# Patient Record
Sex: Female | Born: 1940 | Hispanic: No | Marital: Married | Smoking: Never smoker
Health system: Southern US, Community
[De-identification: ages and names within clinical notes are randomized; demographics above are authoritative.]

## PROBLEM LIST (undated history)

## (undated) DIAGNOSIS — H3530 Unspecified macular degeneration: Secondary | ICD-10-CM

## (undated) DIAGNOSIS — I499 Cardiac arrhythmia, unspecified: Secondary | ICD-10-CM

## (undated) DIAGNOSIS — D649 Anemia, unspecified: Secondary | ICD-10-CM

## (undated) DIAGNOSIS — T884XXA Failed or difficult intubation, initial encounter: Secondary | ICD-10-CM

## (undated) DIAGNOSIS — R0600 Dyspnea, unspecified: Secondary | ICD-10-CM

## (undated) DIAGNOSIS — Z972 Presence of dental prosthetic device (complete) (partial): Secondary | ICD-10-CM

## (undated) DIAGNOSIS — K589 Irritable bowel syndrome without diarrhea: Secondary | ICD-10-CM

## (undated) DIAGNOSIS — K219 Gastro-esophageal reflux disease without esophagitis: Secondary | ICD-10-CM

## (undated) DIAGNOSIS — J45909 Unspecified asthma, uncomplicated: Secondary | ICD-10-CM

## (undated) DIAGNOSIS — I10 Essential (primary) hypertension: Secondary | ICD-10-CM

## (undated) DIAGNOSIS — C801 Malignant (primary) neoplasm, unspecified: Secondary | ICD-10-CM

## (undated) DIAGNOSIS — K529 Noninfective gastroenteritis and colitis, unspecified: Secondary | ICD-10-CM

## (undated) DIAGNOSIS — C50919 Malignant neoplasm of unspecified site of unspecified female breast: Secondary | ICD-10-CM

## (undated) HISTORY — PX: CHOLECYSTECTOMY: SHX55

## (undated) HISTORY — PX: TONSILLECTOMY: SUR1361

## (undated) HISTORY — PX: REPLACEMENT TOTAL KNEE: SUR1224

## (undated) HISTORY — PX: BREAST LUMPECTOMY: SHX2

## (undated) HISTORY — PX: ABDOMINAL HYSTERECTOMY: SHX81

## (undated) HISTORY — PX: JOINT REPLACEMENT: SHX530

## (undated) HISTORY — PX: COLON SURGERY: SHX602

## (undated) HISTORY — PX: EYE SURGERY: SHX253

## (undated) HISTORY — PX: FRACTURE SURGERY: SHX138

## (undated) HISTORY — PX: BREAST BIOPSY: SHX20

---
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
## 1993-04-07 HISTORY — PX: COLON SURGERY: SHX602

## 1993-04-07 HISTORY — PX: COLOSTOMY CLOSURE: SHX1381

## 2011-10-21 DIAGNOSIS — D649 Anemia, unspecified: Secondary | ICD-10-CM | POA: Insufficient documentation

## 2011-11-17 DIAGNOSIS — Z96659 Presence of unspecified artificial knee joint: Secondary | ICD-10-CM | POA: Insufficient documentation

## 2013-04-07 DIAGNOSIS — D649 Anemia, unspecified: Secondary | ICD-10-CM

## 2013-04-07 HISTORY — DX: Anemia, unspecified: D64.9

## 2013-06-27 DIAGNOSIS — E559 Vitamin D deficiency, unspecified: Secondary | ICD-10-CM | POA: Insufficient documentation

## 2013-06-27 DIAGNOSIS — H3530 Unspecified macular degeneration: Secondary | ICD-10-CM | POA: Insufficient documentation

## 2013-12-30 DIAGNOSIS — K580 Irritable bowel syndrome with diarrhea: Secondary | ICD-10-CM | POA: Insufficient documentation

## 2013-12-30 DIAGNOSIS — F419 Anxiety disorder, unspecified: Secondary | ICD-10-CM | POA: Insufficient documentation

## 2014-02-23 DIAGNOSIS — E669 Obesity, unspecified: Secondary | ICD-10-CM | POA: Insufficient documentation

## 2014-09-15 DIAGNOSIS — Z23 Encounter for immunization: Secondary | ICD-10-CM | POA: Insufficient documentation

## 2014-09-15 DIAGNOSIS — R21 Rash and other nonspecific skin eruption: Secondary | ICD-10-CM | POA: Insufficient documentation

## 2014-09-15 DIAGNOSIS — E781 Pure hyperglyceridemia: Secondary | ICD-10-CM | POA: Insufficient documentation

## 2014-11-18 DIAGNOSIS — K5732 Diverticulitis of large intestine without perforation or abscess without bleeding: Secondary | ICD-10-CM | POA: Insufficient documentation

## 2014-12-30 DIAGNOSIS — F40240 Claustrophobia: Secondary | ICD-10-CM | POA: Insufficient documentation

## 2016-03-06 DIAGNOSIS — K2100 Gastro-esophageal reflux disease with esophagitis, without bleeding: Secondary | ICD-10-CM | POA: Insufficient documentation

## 2016-03-06 DIAGNOSIS — J302 Other seasonal allergic rhinitis: Secondary | ICD-10-CM | POA: Insufficient documentation

## 2016-03-06 DIAGNOSIS — I10 Essential (primary) hypertension: Secondary | ICD-10-CM | POA: Insufficient documentation

## 2016-10-20 ENCOUNTER — Other Ambulatory Visit: Payer: Self-pay | Admitting: Pediatrics

## 2016-10-20 DIAGNOSIS — Z1231 Encounter for screening mammogram for malignant neoplasm of breast: Secondary | ICD-10-CM

## 2016-11-26 ENCOUNTER — Inpatient Hospital Stay: Admission: RE | Admit: 2016-11-26 | Payer: Self-pay | Source: Ambulatory Visit

## 2016-12-01 ENCOUNTER — Encounter: Payer: Self-pay | Admitting: Radiology

## 2016-12-01 ENCOUNTER — Ambulatory Visit
Admission: RE | Admit: 2016-12-01 | Discharge: 2016-12-01 | Disposition: A | Discharge status: Home / Self Care | Payer: Medicare Other | Source: Ambulatory Visit | Attending: Pediatrics | Admitting: Pediatrics

## 2016-12-01 DIAGNOSIS — Z1231 Encounter for screening mammogram for malignant neoplasm of breast: Secondary | ICD-10-CM | POA: Insufficient documentation

## 2017-01-16 DIAGNOSIS — G43009 Migraine without aura, not intractable, without status migrainosus: Secondary | ICD-10-CM | POA: Insufficient documentation

## 2017-01-22 DIAGNOSIS — M25531 Pain in right wrist: Secondary | ICD-10-CM | POA: Insufficient documentation

## 2017-01-22 DIAGNOSIS — G8929 Other chronic pain: Secondary | ICD-10-CM | POA: Insufficient documentation

## 2017-01-22 DIAGNOSIS — M1811 Unilateral primary osteoarthritis of first carpometacarpal joint, right hand: Secondary | ICD-10-CM | POA: Insufficient documentation

## 2017-10-22 DIAGNOSIS — M654 Radial styloid tenosynovitis [de Quervain]: Secondary | ICD-10-CM | POA: Insufficient documentation

## 2017-10-22 DIAGNOSIS — M79642 Pain in left hand: Secondary | ICD-10-CM | POA: Insufficient documentation

## 2017-11-18 ENCOUNTER — Other Ambulatory Visit: Payer: Self-pay | Admitting: Pediatrics

## 2017-11-18 DIAGNOSIS — Z1231 Encounter for screening mammogram for malignant neoplasm of breast: Secondary | ICD-10-CM

## 2017-12-02 ENCOUNTER — Encounter (INDEPENDENT_AMBULATORY_CARE_PROVIDER_SITE_OTHER): Payer: Self-pay

## 2017-12-02 ENCOUNTER — Ambulatory Visit
Admission: RE | Admit: 2017-12-02 | Discharge: 2017-12-02 | Disposition: A | Discharge status: Home / Self Care | Payer: Medicare Other | Source: Ambulatory Visit | Attending: Pediatrics | Admitting: Pediatrics

## 2017-12-02 DIAGNOSIS — Z1231 Encounter for screening mammogram for malignant neoplasm of breast: Secondary | ICD-10-CM

## 2017-12-02 HISTORY — DX: Malignant (primary) neoplasm, unspecified: C80.1

## 2018-01-07 DIAGNOSIS — R002 Palpitations: Secondary | ICD-10-CM | POA: Insufficient documentation

## 2018-01-07 DIAGNOSIS — R011 Cardiac murmur, unspecified: Secondary | ICD-10-CM | POA: Insufficient documentation

## 2018-03-29 ENCOUNTER — Encounter: Payer: Self-pay | Admitting: Emergency Medicine

## 2018-03-29 ENCOUNTER — Ambulatory Visit
Admission: EM | Admit: 2018-03-29 | Discharge: 2018-03-29 | Disposition: A | Discharge status: Home / Self Care | Payer: Medicare Other | Attending: Family Medicine | Admitting: Family Medicine

## 2018-03-29 ENCOUNTER — Other Ambulatory Visit: Payer: Self-pay

## 2018-03-29 DIAGNOSIS — N390 Urinary tract infection, site not specified: Secondary | ICD-10-CM | POA: Diagnosis not present

## 2018-03-29 DIAGNOSIS — R319 Hematuria, unspecified: Secondary | ICD-10-CM | POA: Insufficient documentation

## 2018-03-29 DIAGNOSIS — R42 Dizziness and giddiness: Secondary | ICD-10-CM | POA: Insufficient documentation

## 2018-03-29 HISTORY — DX: Noninfective gastroenteritis and colitis, unspecified: K52.9

## 2018-03-29 HISTORY — DX: Essential (primary) hypertension: I10

## 2018-03-29 LAB — URINALYSIS, COMPLETE (UACMP) WITH MICROSCOPIC
BILIRUBIN URINE: NEGATIVE
Glucose, UA: NEGATIVE mg/dL
Ketones, ur: NEGATIVE mg/dL
NITRITE: NEGATIVE
PROTEIN: NEGATIVE mg/dL
SPECIFIC GRAVITY, URINE: 1.01 (ref 1.005–1.030)
SQUAMOUS EPITHELIAL / LPF: NONE SEEN (ref 0–5)
pH: 7 (ref 5.0–8.0)

## 2018-03-29 MED ORDER — CEPHALEXIN 500 MG PO CAPS: 500.0000 mg | ORAL_CAPSULE | Freq: Two times a day (BID) | ORAL | 0 refills | Status: DC

## 2018-03-29 NOTE — Discharge Instructions (Addendum)
Increase water intake Over the counter dramamine as needed

## 2018-03-29 NOTE — ED Triage Notes (Signed)
Patient in today c/o dysuria and back pain x 1 week. Patient states she took OTC urinary pain relief and symptoms got better, but came back. Patient denies fever.  Patient also woke up this morning with vertigo.

## 2018-03-29 NOTE — ED Provider Notes (Signed)
[facility]    CSN: [id number] Arrival date & time: 03/29/18  1101     History   Chief Complaint Chief Complaint  Patient presents with  . Dysuria  . Dizziness    HPI Lauren Riley is a 77 y.o. female.   The history is provided by the patient.  Dysuria  Pain quality:  Burning Pain severity:  Mild Onset quality:  Sudden Duration:  6 days Timing:  Constant Progression:  Unchanged Chronicity:  New Recent urinary tract infections: no   Relieved by:  Phenazopyridine Urinary symptoms: frequent urination   Associated symptoms: no abdominal pain, no fever, no flank pain, no genital lesions, no nausea, no vaginal discharge and no vomiting   Associated symptoms comment:  Low back pain; also states this morning felt some slight vertigo which she's had before in the past; currently states not feeling vertigo or dizziness; lo   Past Medical History:  Diagnosis Date  . Cancer ([place])    skin ca  . Chronic diarrhea   . Hypertension     There are no active problems to display for this patient.   Past Surgical History:  Procedure Laterality Date  . ABDOMINAL HYSTERECTOMY    . BREAST BIOPSY     unsure which side and between 1974 and 1980 Negative was a cyst  . CHOLECYSTECTOMY    . COLON SURGERY    . FRACTURE SURGERY     femur, ulna and radius  . JOINT REPLACEMENT     bilateral knee  . REPLACEMENT TOTAL KNEE Bilateral   . TONSILLECTOMY      OB History   No obstetric history on file.      Home Medications    Prior to Admission medications   Medication Sig Start Date End Date Taking? Authorizing Provider  ALPRAZolam Duanne Moron) 0.25 MG tablet Take by mouth. 12/27/14  Yes [provider]  Cholecalciferol (VITAMIN D3) 50 MCG (2000 UT) capsule 1 tab daily   Yes [provider]  flunisolide (NASALIDE) 25 MCG/ACT (0.025%) SOLN 2 (two) times daily as needed. 10/27/13  Yes [provider]  loperamide (IMODIUM A-D) 2 MG tablet Take by  mouth.   Yes [provider]  metoprolol succinate (TOPROL-XL) 100 MG 24 hr tablet Take by mouth. 09/07/15 11/13/18 Yes [provider]  Multiple Vitamins-Minerals (PRESERVISION AREDS 2) CAPS Take by mouth.   Yes [provider]  mupirocin ointment (BACTROBAN) 2 % APPLY OINTMENT EXTERNALLY THREE TIMES DAILY FOR 7 DAYS OR UNTIL CLEARED 04/22/17  Yes [provider]  pantoprazole (PROTONIX) 40 MG tablet Take by mouth. 09/07/15 01/01/19 Yes [provider]  SUMAtriptan (IMITREX) 50 MG tablet Take by mouth. 09/07/15 11/13/18 Yes [provider]  cephALEXin (KEFLEX) 500 MG capsule Take 1 capsule (500 mg total) by mouth 2 (two) times daily. 03/29/18   Norval Gable, MD    Family History Family History  Problem Relation Age of Onset  . Breast cancer Sister 36  . Breast cancer Maternal Aunt        mat aunt  . Parkinson's disease Mother   . Congestive Heart Failure Father   . Hypertension Father     Social History Social History   Tobacco Use  . Smoking status: Never Smoker  . Smokeless tobacco: Never Used  Substance Use Topics  . Alcohol use: Never    Frequency: Never  . Drug use: Never     Allergies   Nsaids; Fenofibrate; Niacin; Pravastatin; Fish oil; and Tape  Review of Systems Review of Systems  Constitutional: Negative for fever.  Gastrointestinal: Negative for abdominal pain, nausea and vomiting.  Genitourinary: Positive for dysuria. Negative for flank pain and vaginal discharge.     Physical Exam Triage Vital Signs ED Triage Vitals  Enc Vitals Group     BP 03/29/18 1114 (!) 156/75     Pulse Rate 03/29/18 1114 83     Resp 03/29/18 1114 16     Temp 03/29/18 1114 98.3 F (36.8 C)     Temp Source 03/29/18 1114 Oral     SpO2 03/29/18 1114 97 %     Weight 03/29/18 1115 185 lb (83.9 kg)     Height 03/29/18 1115 5\' 1"  (1.549 m)     Head Circumference --      Peak Flow --      Pain Score 03/29/18 1113 4     Pain Loc --       Pain Edu? --      Excl. in [place]? --    No data found.  Updated Vital Signs BP (!) 156/75 (BP Location: Left Arm)   Pulse 83   Temp 98.3 F (36.8 C) (Oral)   Resp 16   Ht 5\' 1"  (1.549 m)   Wt 83.9 kg   LMP 12/01/2016   SpO2 97%   BMI 34.96 kg/m   Visual Acuity Right Eye Distance:   Left Eye Distance:   Bilateral Distance:    Right Eye Near:   Left Eye Near:    Bilateral Near:     Physical Exam Vitals signs and nursing note reviewed.  Constitutional:      General: She is not in acute distress.    Appearance: Normal appearance. She is not ill-appearing, toxic-appearing or diaphoretic.  Abdominal:     General: There is no distension.     Palpations: Abdomen is soft.  Neurological:     Mental Status: She is alert.      UC Treatments / Results  Labs (all labs ordered are listed, but only abnormal results are displayed) Labs Reviewed  URINALYSIS, COMPLETE (UACMP) WITH MICROSCOPIC - Abnormal; Notable for the following components:      Result Value   APPearance HAZY (*)    Hgb urine dipstick TRACE (*)    Leukocytes, UA LARGE (*)    Bacteria, UA FEW (*)    All other components within normal limits    EKG None  Radiology No results found.  Procedures Procedures (including critical care time)  Medications Ordered in UC Medications - No data to display  Initial Impression / Assessment and Plan / UC Course  I have reviewed the triage vital signs and the nursing notes.  Pertinent labs & imaging results that were available during my care of the patient were reviewed by me and considered in my medical decision making (see chart for details).      Final Clinical Impressions(s) / UC Diagnoses   Final diagnoses:  Urinary tract infection with hematuria, site unspecified  Vertigo     Discharge Instructions     Increase water intake Over the counter dramamine as needed    ED Prescriptions    Medication Sig Dispense Auth. Provider   cephALEXin  (KEFLEX) 500 MG capsule Take 1 capsule (500 mg total) by mouth 2 (two) times daily. 14 capsule Norval Gable, MD     1. Lab results and diagnosis reviewed with patient 2. rx as per orders above; reviewed possible side effects, interactions, risks  and benefits  3. Recommend supportive treatment as above  4. Follow-up prn if symptoms worsen or don't improve   Controlled Substance Prescriptions [place] Controlled Substance Registry consulted? Not Applicable   Norval Gable, MD 03/29/18 [phone]

## 2018-12-06 ENCOUNTER — Other Ambulatory Visit: Payer: Self-pay | Admitting: Pediatrics

## 2018-12-06 DIAGNOSIS — Z1231 Encounter for screening mammogram for malignant neoplasm of breast: Secondary | ICD-10-CM

## 2018-12-22 ENCOUNTER — Other Ambulatory Visit: Payer: Self-pay

## 2018-12-22 ENCOUNTER — Ambulatory Visit
Admission: RE | Admit: 2018-12-22 | Discharge: 2018-12-22 | Disposition: A | Discharge status: Home / Self Care | Payer: Medicare Other | Source: Ambulatory Visit | Attending: Pediatrics | Admitting: Pediatrics

## 2018-12-22 DIAGNOSIS — Z1231 Encounter for screening mammogram for malignant neoplasm of breast: Secondary | ICD-10-CM | POA: Insufficient documentation

## 2018-12-23 ENCOUNTER — Other Ambulatory Visit: Payer: Self-pay | Admitting: Otolaryngology

## 2018-12-23 ENCOUNTER — Other Ambulatory Visit: Payer: Self-pay

## 2018-12-23 ENCOUNTER — Ambulatory Visit
Admission: RE | Admit: 2018-12-23 | Discharge: 2018-12-23 | Disposition: A | Discharge status: Home / Self Care | Payer: Medicare Other | Attending: Otolaryngology | Admitting: Otolaryngology

## 2018-12-23 ENCOUNTER — Ambulatory Visit
Admission: RE | Admit: 2018-12-23 | Discharge: 2018-12-23 | Disposition: A | Discharge status: Home / Self Care | Payer: Medicare Other | Source: Ambulatory Visit | Attending: Otolaryngology | Admitting: Otolaryngology

## 2018-12-23 DIAGNOSIS — J321 Chronic frontal sinusitis: Secondary | ICD-10-CM | POA: Diagnosis present

## 2018-12-28 ENCOUNTER — Other Ambulatory Visit: Payer: Self-pay | Admitting: Pediatrics

## 2018-12-28 DIAGNOSIS — R928 Other abnormal and inconclusive findings on diagnostic imaging of breast: Secondary | ICD-10-CM

## 2018-12-28 DIAGNOSIS — N6310 Unspecified lump in the right breast, unspecified quadrant: Secondary | ICD-10-CM

## 2018-12-31 ENCOUNTER — Ambulatory Visit
Admission: RE | Admit: 2018-12-31 | Discharge: 2018-12-31 | Disposition: A | Discharge status: Home / Self Care | Payer: Medicare Other | Source: Ambulatory Visit | Attending: Pediatrics | Admitting: Pediatrics

## 2018-12-31 DIAGNOSIS — R928 Other abnormal and inconclusive findings on diagnostic imaging of breast: Secondary | ICD-10-CM

## 2018-12-31 DIAGNOSIS — N6310 Unspecified lump in the right breast, unspecified quadrant: Secondary | ICD-10-CM | POA: Diagnosis present

## 2019-01-04 ENCOUNTER — Other Ambulatory Visit: Payer: Self-pay | Admitting: Pediatrics

## 2019-01-04 DIAGNOSIS — R928 Other abnormal and inconclusive findings on diagnostic imaging of breast: Secondary | ICD-10-CM

## 2019-01-04 DIAGNOSIS — N6310 Unspecified lump in the right breast, unspecified quadrant: Secondary | ICD-10-CM

## 2019-01-06 DIAGNOSIS — C50919 Malignant neoplasm of unspecified site of unspecified female breast: Secondary | ICD-10-CM

## 2019-01-06 HISTORY — DX: Malignant neoplasm of unspecified site of unspecified female breast: C50.919

## 2019-01-12 ENCOUNTER — Ambulatory Visit
Admission: RE | Admit: 2019-01-12 | Discharge: 2019-01-12 | Disposition: A | Discharge status: Home / Self Care | Payer: Medicare Other | Source: Ambulatory Visit | Attending: Pediatrics | Admitting: Pediatrics

## 2019-01-12 DIAGNOSIS — R928 Other abnormal and inconclusive findings on diagnostic imaging of breast: Secondary | ICD-10-CM | POA: Diagnosis present

## 2019-01-12 DIAGNOSIS — N6310 Unspecified lump in the right breast, unspecified quadrant: Secondary | ICD-10-CM | POA: Diagnosis present

## 2019-01-12 HISTORY — PX: BREAST BIOPSY: SHX20

## 2019-01-18 ENCOUNTER — Other Ambulatory Visit: Payer: Self-pay | Admitting: Anatomic Pathology & Clinical Pathology

## 2019-01-18 LAB — SURGICAL PATHOLOGY

## 2019-01-18 NOTE — Progress Notes (Signed)
  Oncology Nurse Navigator Documentation  Navigator Location: [facility] (01/18/19 1600)   )Navigator Encounter Type: Introductory Phone Call (01/18/19 1600)   Abnormal Finding Date: 12/31/18 (01/18/19 1600) Confirmed Diagnosis Date: 01/12/19 (01/18/19 1600)                       Interventions: Coordination of Care;Psycho-Social Support (01/18/19 1600)   Coordination of Care: Appts (01/18/19 1600)                  Time Spent with Patient: 60 (01/18/19 1600)   Introduced IT trainer.  Scheduled Surgical Consult with Dr. Peyton Najjar. He requestedto wait until surgical pathology result before scheduling Med/Onc.  Patient aware.

## 2019-01-19 ENCOUNTER — Other Ambulatory Visit: Payer: Self-pay | Admitting: General Surgery

## 2019-01-19 ENCOUNTER — Ambulatory Visit: Payer: Self-pay | Admitting: General Surgery

## 2019-01-19 DIAGNOSIS — D0511 Intraductal carcinoma in situ of right breast: Secondary | ICD-10-CM

## 2019-01-19 NOTE — H&P (Signed)
PATIENT PROFILE: Lauren Riley is a 78 y.o. female who presents to the Clinic for consultation at the request of Dr. 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  PCP:  Janifer Adie, MD  HISTORY OF PRESENT ILLNESS: Lauren Riley reports she had her regular mammogram.  She denies any breast pain, skin changes, nipple discharge, nipple retraction.  She had her screening mammogram December 23, 2018.  He was found with a possible mass on the right breast.  This led to diagnostic mammogram and ultrasound.  This showed a 9 mm suspicious mass on the right breast at 9:30 position.  Patient had a ultrasound-guided core biopsy.  The biopsy shows intra-papillary cells with absent myoepithelial markers.  This is concerning enough intraductal papillary carcinoma versus encapsulated papillary carcinoma.  Family history of breast cancer: Sister Family history of other cancers: None Menarche: Unknown Menopause: Unknown Used OCP: No Used estrogen and progesterone therapy: No History of Radiation to the chest: No  PROBLEM LIST:         Problem List  Date Reviewed: 04/12/2018         Noted   Heart palpitations 01/07/2018   Heart murmur, unspecified 01/07/2018   Radial styloid tenosynovitis (de quervain) 10/22/2017   Left hand pain 10/22/2017   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 01/22/2017   Chronic pain of right wrist 01/22/2017   Migraine without aura and without status migrainosus, not intractable (Chronic) 01/16/2017   Hypertension, well controlled (Chronic) 03/06/2016   Gastroesophageal reflux disease with esophagitis (Chronic) 03/06/2016   Irritable bowel syndrome with diarrhea (Chronic) 03/06/2016   Chronic seasonal allergic rhinitis (Chronic) 03/06/2016   Diverticulitis of large intestine without perforation or abscess without bleeding 11/18/2014   Overview    Last Assessment & Plan:  rx for cipro and flagyl written and printed  for trip. Pt given strict instructions not to take unless diverticulitis develops, instructed to see physician on ship if needed. Counseled on risks of side effects (c diff colitis) from unnecessary antibiotic usage. If she develops diverticulitis needs to be seen when she returns from her trip      Hypertriglyceridemia 09/15/2014   Overview    Last Assessment & Plan:  Continue questran. Pt is interested in starting red yeast rice- encouraged this today      Obesity (BMI 35.0-39.9 without comorbidity), unspecified 02/23/2014   Overview    Last Assessment & Plan:  Will do trial of Belviq today. Discussed side effects. Would avoid phentermine given her blood pressure ups and downs. Was concerned with side effects of orlistat and cost of medication. Encouraged to try recumbant bike and increase exercise. Given handout today on eating healthier      Anxiety 12/30/2013   Overview    Last Assessment & Plan:  Continue wellbutrin      IBS (irritable bowel syndrome) 12/30/2013   Overview    Last Assessment & Plan:  Uncontrolled. Continue current regimen per Dr. Shirlyn Goltz.      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      GENERAL REVIEW OF SYSTEMS:   General ROS: negative for - chills, fatigue, fever, weight gain or weight loss Allergy and Immunology ROS: negative for - hives  Hematological and Lymphatic ROS: negative for - bleeding problems or  bruising, negative for palpable nodes Endocrine ROS: negative for - heat or cold intolerance, hair changes Respiratory ROS: negative for - cough, shortness of breath or wheezing Cardiovascular ROS: no chest pain or palpitations GI ROS: negative for nausea, vomiting, abdominal pain, diarrhea,  constipation Musculoskeletal ROS: negative for - joint swelling or muscle pain Neurological ROS: negative for - confusion, syncope Dermatological ROS: negative for pruritus and rash Psychiatric: negative for anxiety, depression, difficulty sleeping and memory loss  MEDICATIONS: Current Medications        Current Outpatient Medications  Medication Sig Dispense Refill  . cholecalciferol (VITAMIN D3) 2,000 unit capsule 1 tab daily    . clonazePAM (KLONOPIN) 0.5 MG tablet Take 0.5 mg by mouth 2 (two) times daily as needed for Anxiety    . colestipol (COLESTID) 1 gram tablet TAKE 2 TABLETS BY MOUTH TWICE DAILY 360 tablet 0  . flunisolide (NASALIDE) 25 mcg (0.025 %) nasal spray USe 2 sprays in each  nostril every 12 hours    . loperamide (IMODIUM A-D) 2 mg tablet Take 2 mg by mouth 4 (four) times daily as needed for Diarrhea.    . metoprolol succinate (TOPROL-XL) 100 MG XL tablet Take 1 tablet (100 mg total) by mouth once daily 90 tablet 3  . mupirocin (BACTROBAN) 2 % ointment APPLY OINTMENT EXTERNALLY THREE TIMES DAILY FOR 7 DAYS OR UNTIL CLEARED 22 g 1  . pantoprazole (PROTONIX) 40 MG DR tablet Take 1 tablet (40 mg total) by mouth once daily 30 tablet 0  . SUMAtriptan (IMITREX) 50 MG tablet Take 1 tablet (50 mg total) by mouth as needed May take a second dose after 2 hours if needed. Max daily dose 200 mg 10 tablet 11  . vit A-vit C-vit E-zinc-copper (EYE VITAMIN AND MINERALS) 7,160-113-100 unit-mg-unit Tab Take by mouth.     No current facility-administered medications for this visit.       ALLERGIES: Nsaids (non-steroidal anti-inflammatory drug), Fenofibrate, Niacin, Others, Pravastatin, Adhesive tape-silicones, and Fish oil  PAST MEDICAL HISTORY:     Past Medical History:  Diagnosis Date  . Asthma without status asthmaticus, unspecified   . Chronic seasonal allergic rhinitis 03/06/2016  . Diverticulitis   . Gastroesophageal reflux disease with esophagitis 03/06/2016   . Hypertension, well controlled 03/06/2016  . Irritable bowel syndrome with diarrhea 03/06/2016  . Migraine without aura and without status migrainosus, not intractable 01/16/2017  . Postcholecystectomy syndrome   . Skin cancer of arm   . Vitamin D deficiency 06/27/2013    PAST SURGICAL HISTORY: Past Surgical History:  Procedure Laterality Date  . APPENDECTOMY    . BLEPHAROPLASTY    . CATARACT EXTRACTION    . CHOLECYSTECTOMY    . COLON SURGERY     Subsequent colostomy takedown  . COLONOSCOPY W/BIOPSY N/A 07/14/2017   Procedure: Colonoscopy;  Surgeon: Raynelle Jan., MD;  Location: [place] [facility];  Service: Gastroenterology;  Laterality: N/A;  . Femur fracture surgery    . HYSTERECTOMY    . JOINT REPLACEMENT Bilateral    Knee  . REMOVAL HARDWARE LOWER LEG    . REMOVAL HARDWARE WRIST HAND/FINGER    . TONSILLECTOMY       FAMILY HISTORY:      Family History  Problem Relation Age of Onset  . Coronary Artery Disease (Blocked arteries around heart) Father   . High blood pressure (Hypertension) Father   . Stroke Father   . High blood pressure (Hypertension) Sister   . Thyroid disease Sister   .  Breast cancer Sister   . High blood pressure (Hypertension) Son      SOCIAL HISTORY: Social History          Socioeconomic History  . Marital status: Married    Spouse name: Not on file  . Number of children: 2  . Years of education: 8  . Highest education level: Not on file  Occupational History  . Occupation: Retired Patent examiner  Social Needs  . Financial resource strain: Not on file  . Food insecurity    Worry: Not on file    Inability: Not on file  . Transportation needs    Medical: Not on file    Non-medical: Not on file  Tobacco Use  . Smoking status: Never Smoker  . Smokeless tobacco: Never Used  Substance and Sexual Activity  . Alcohol use: No  . Drug use: No  . Sexual activity:  Never  Other Topics Concern  . Not on file  Social History Narrative   Relocated from [place] area in early 2017 to live near her son and take care of their children ages 49 and 59 who are home schooled by her daughter-in-law who is a Marine scientist at [employer].  Lives with her husband.  Had a daughter who died in a motor vehicle accident in the early 79s.  Non-smoker.  Does not drink alcohol.  Well water.      PHYSICAL EXAM:    Vitals:   01/19/19 1050  BP: 140/69  Pulse: 71   Body mass index is 35.9 kg/m. Weight: 86.2 kg (190 lb)   GENERAL: Alert, active, oriented x3  HEENT: Pupils equal reactive to light. Extraocular movements are intact. Sclera clear. Palpebral conjunctiva normal red color.Pharynx clear.  NECK: Supple with no palpable mass and no adenopathy.  LUNGS: Sound clear with no rales rhonchi or wheezes.  HEART: Regular rhythm S1 and S2 without murmur.  BREAST: breasts appear normal, no suspicious masses, no skin or nipple changes or axillary nodes.  ABDOMEN: Soft and depressible, nontender with no palpable mass, no hepatomegaly.  EXTREMITIES: Well-developed well-nourished symmetrical with no dependent edema.  NEUROLOGICAL: Awake alert oriented, facial expression symmetrical, moving all extremities.  REVIEW OF DATA: I have reviewed the following data today:      No visits with results within 3 Month(s) from this visit.  Latest known visit with results is:  Ancillary Procedure on 01/15/2018  Component Date Value  . LV Ejection Fraction (%) 01/15/2018 55   . Aortic Valve Regurgitati* 01/15/2018 none   . Aortic Valve Stenosis Gr* 01/15/2018 none   . Aortic Valve Max Velocit* 01/15/2018 1.1   . Aortic Valve Stenosis Me* 01/15/2018 2.6   . Mitral Valve Regurgitati* 01/15/2018 mild   . Mitral Valve Stenosis Gr* 01/15/2018 none   . Tricuspid Valve Regurgit* 01/15/2018 trivial   . Tricuspid Valve Regurgit* 01/15/2018 2.5   . Right Ventricle Systolic*  [id number]   . LV End Diastolic Diamete* A999333 4.6   . LV End Systolic Diameter* A999333 3.2   . LV Septum Wall Thickness* 01/15/2018 0.88   . LV Posterior Wall Thickn* 01/15/2018 0.66   . Left Atrium Diameter (cm) 01/15/2018 3.9     SURGICAL PATHOLOGY CASE: [id number] PATIENT: Orvan Falconer Surgical Pathology Report  Specimen Submitted: A. Breast, right  Clinical History: Suspect benign, possibly cyst. Small right breast mass; Coil-shaped marker deployed following ultrasound guided biopsy of RIGHT breast at 9:30 o'clock, 10 cmfn  DIAGNOSIS: A. BREAST, RIGHT AT 930 O'CLOCK,  10 CM FROM THE NIPPLE; ULTRASOUND-GUIDED CORE BIOPSY: - CYSTIC SPACES CONTAINING PAPILLARY EPITHELIAL PROLIFERATIONS WITH ABSENCE OF MYOEPITHELIAL CELLS, SEE COMMENT. - NEGATIVE FOR INVASIVE CARCINOMA.  Comment: Sections display cystic spaces with a papillary epithelial proliferation showing mild cytologic atypia and a paucity of stromal elements. Immunohistochemical stains were performed. These stains demonstrate an absence of myoepithelial markers calponin and p63. The differential diagnosis includes intraductal papillary carcinoma and encapsulated papillary carcinoma. These entities are considered non-invasive, and there is no evidence of stromal invasion in the tissue present for evaluation.  ASSESSMENT: Lauren Riley is a 78 y.o. female presenting for consultation for right breast cancer.    Patient was oriented again about the pathology results. Surgical alternatives were discussed with patient including partial vs total mastectomy. Surgical technique and post operative care was discussed with patient. Risk of surgery was discussed with patient including but not limited to: wound infection, seroma, hematoma, brachial plexopathy, mondor's disease (thrombosis of small veins of breast), chronic wound pain, breast lymphedema, altered sensation to the nipple and cosmesis among others.    Since there is a high suspicious of encapsulated, noninvasive carcinoma I recommend right breast needle guided partial mastectomy.  At this moment I do not think that there is any indication for sentinel needle biopsy.  Patient was oriented about the surgical recommendations.  She was oriented that we will send the specimen for gross evaluation of margins.  If the margins are clear with the reexcision.  Also oriented that if the final pathology combined with invasive carcinoma, she will need to have sentinel needle biopsy done.  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 Needle guided partial mastectomy of the right breast (19301) 2. CBC, CMP 3. Avoid taking aspirin 5 days before surgery 4. Contact us if has any question or concern.   Patient verbalized understanding, all questions were answered, and were agreeable with the plan outlined above.   This was a 60-minute encounter most of the time counseling the patient and coordinating plan of care.  Herbert Pun, MD  Electronically signed by Herbert Pun, MD

## 2019-01-19 NOTE — H&P (View-Only) (Signed)
PATIENT PROFILE: Lauren Riley is a 78 y.o. female who presents to the Clinic for consultation at the request of Dr. 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  PCP:  Janifer Adie, MD  HISTORY OF PRESENT ILLNESS: [name] reports she had her regular mammogram.  She denies any breast pain, skin changes, nipple discharge, nipple retraction.  She had her screening mammogram December 23, 2018.  He was found with a possible mass on the right breast.  This led to diagnostic mammogram and ultrasound.  This showed a 9 mm suspicious mass on the right breast at 9:30 position.  Patient had a ultrasound-guided core biopsy.  The biopsy shows intra-papillary cells with absent myoepithelial markers.  This is concerning enough intraductal papillary carcinoma versus encapsulated papillary carcinoma.  Family history of breast cancer: Sister Family history of other cancers: None Menarche: Unknown Menopause: Unknown Used OCP: No Used estrogen and progesterone therapy: No History of Radiation to the chest: No  PROBLEM LIST:         Problem List  Date Reviewed: 04/12/2018         Noted   Heart palpitations 01/07/2018   Heart murmur, unspecified 01/07/2018   Radial styloid tenosynovitis (de quervain) 10/22/2017   Left hand pain 10/22/2017   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 01/22/2017   Chronic pain of right wrist 01/22/2017   Migraine without aura and without status migrainosus, not intractable (Chronic) 01/16/2017   Hypertension, well controlled (Chronic) 03/06/2016   Gastroesophageal reflux disease with esophagitis (Chronic) 03/06/2016   Irritable bowel syndrome with diarrhea (Chronic) 03/06/2016   Chronic seasonal allergic rhinitis (Chronic) 03/06/2016   Diverticulitis of large intestine without perforation or abscess without bleeding 11/18/2014   Overview    Last Assessment & Plan:  rx for cipro and flagyl written and printed  for trip. Pt given strict instructions not to take unless diverticulitis develops, instructed to see physician on ship if needed. Counseled on risks of side effects (c diff colitis) from unnecessary antibiotic usage. If she develops diverticulitis needs to be seen when she returns from her trip      Hypertriglyceridemia 09/15/2014   Overview    Last Assessment & Plan:  Continue questran. Pt is interested in starting red yeast rice- encouraged this today      Obesity (BMI 35.0-39.9 without comorbidity), unspecified 02/23/2014   Overview    Last Assessment & Plan:  Will do trial of Belviq today. Discussed side effects. Would avoid phentermine given her blood pressure ups and downs. Was concerned with side effects of orlistat and cost of medication. Encouraged to try recumbant bike and increase exercise. Given handout today on eating healthier      Anxiety 12/30/2013   Overview    Last Assessment & Plan:  Continue wellbutrin      IBS (irritable bowel syndrome) 12/30/2013   Overview    Last Assessment & Plan:  Uncontrolled. Continue current regimen per Dr. Shirlyn Goltz.      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      GENERAL REVIEW OF SYSTEMS:   General ROS: negative for - chills, fatigue, fever, weight gain or weight loss Allergy and Immunology ROS: negative for - hives  Hematological and Lymphatic ROS: negative for - bleeding problems or  bruising, negative for palpable nodes Endocrine ROS: negative for - heat or cold intolerance, hair changes Respiratory ROS: negative for - cough, shortness of breath or wheezing Cardiovascular ROS: no chest pain or palpitations GI ROS: negative for nausea, vomiting, abdominal pain, diarrhea,  constipation Musculoskeletal ROS: negative for - joint swelling or muscle pain Neurological ROS: negative for - confusion, syncope Dermatological ROS: negative for pruritus and rash Psychiatric: negative for anxiety, depression, difficulty sleeping and memory loss  MEDICATIONS: Current Medications        Current Outpatient Medications  Medication Sig Dispense Refill  . cholecalciferol (VITAMIN D3) 2,000 unit capsule 1 tab daily    . clonazePAM (KLONOPIN) 0.5 MG tablet Take 0.5 mg by mouth 2 (two) times daily as needed for Anxiety    . colestipol (COLESTID) 1 gram tablet TAKE 2 TABLETS BY MOUTH TWICE DAILY 360 tablet 0  . flunisolide (NASALIDE) 25 mcg (0.025 %) nasal spray USe 2 sprays in each  nostril every 12 hours    . loperamide (IMODIUM A-D) 2 mg tablet Take 2 mg by mouth 4 (four) times daily as needed for Diarrhea.    . metoprolol succinate (TOPROL-XL) 100 MG XL tablet Take 1 tablet (100 mg total) by mouth once daily 90 tablet 3  . mupirocin (BACTROBAN) 2 % ointment APPLY OINTMENT EXTERNALLY THREE TIMES DAILY FOR 7 DAYS OR UNTIL CLEARED 22 g 1  . pantoprazole (PROTONIX) 40 MG DR tablet Take 1 tablet (40 mg total) by mouth once daily 30 tablet 0  . SUMAtriptan (IMITREX) 50 MG tablet Take 1 tablet (50 mg total) by mouth as needed May take a second dose after 2 hours if needed. Max daily dose 200 mg 10 tablet 11  . vit A-vit C-vit E-zinc-copper (EYE VITAMIN AND MINERALS) 7,160-113-100 unit-mg-unit Tab Take by mouth.     No current facility-administered medications for this visit.       ALLERGIES: Nsaids (non-steroidal anti-inflammatory drug), Fenofibrate, Niacin, Others, Pravastatin, Adhesive tape-silicones, and Fish oil  PAST MEDICAL HISTORY:     Past Medical History:  Diagnosis Date  . Asthma without status asthmaticus, unspecified   . Chronic seasonal allergic rhinitis 03/06/2016  . Diverticulitis   . Gastroesophageal reflux disease with esophagitis 03/06/2016   . Hypertension, well controlled 03/06/2016  . Irritable bowel syndrome with diarrhea 03/06/2016  . Migraine without aura and without status migrainosus, not intractable 01/16/2017  . Postcholecystectomy syndrome   . Skin cancer of arm   . Vitamin D deficiency 06/27/2013    PAST SURGICAL HISTORY: Past Surgical History:  Procedure Laterality Date  . APPENDECTOMY    . BLEPHAROPLASTY    . CATARACT EXTRACTION    . CHOLECYSTECTOMY    . COLON SURGERY     Subsequent colostomy takedown  . COLONOSCOPY W/BIOPSY N/A 07/14/2017   Procedure: Colonoscopy;  Surgeon: Raynelle Jan., MD;  Location: [facility];  Service: Gastroenterology;  Laterality: N/A;  . Femur fracture surgery    . HYSTERECTOMY    . JOINT REPLACEMENT Bilateral    Knee  . REMOVAL HARDWARE LOWER LEG    . REMOVAL HARDWARE WRIST HAND/FINGER    . TONSILLECTOMY       FAMILY HISTORY:      Family History  Problem Relation Age of Onset  . Coronary Artery Disease (Blocked arteries around heart) Father   . High blood pressure (Hypertension) Father   . Stroke Father   . High blood pressure (Hypertension) Sister   . Thyroid disease Sister   .  Breast cancer Sister   . High blood pressure (Hypertension) Son      SOCIAL HISTORY: Social History          Socioeconomic History  . Marital status: Married    Spouse name: Not on file  . Number of children: 2  . Years of education: 21  . Highest education level: Not on file  Occupational History  . Occupation: Retired Patent examiner  Social Needs  . Financial resource strain: Not on file  . Food insecurity    Worry: Not on file    Inability: Not on file  . Transportation needs    Medical: Not on file    Non-medical: Not on file  Tobacco Use  . Smoking status: Never Smoker  . Smokeless tobacco: Never Used  Substance and Sexual Activity  . Alcohol use: No  . Drug use: No  . Sexual activity:  Never  Other Topics Concern  . Not on file  Social History Narrative   Relocated from [place] area in early 2017 to live near her son and take care of their children ages 62 and 17 who are home schooled by her daughter-in-law who is a Marine scientist at [employer].  Lives with her husband.  Had a daughter who died in a motor vehicle accident in the early 45s.  Non-smoker.  Does not drink alcohol.  Well water.      PHYSICAL EXAM:    Vitals:   01/19/19 1050  BP: 140/69  Pulse: 71   Body mass index is 35.9 kg/m. Weight: 86.2 kg (190 lb)   GENERAL: Alert, active, oriented x3  HEENT: Pupils equal reactive to light. Extraocular movements are intact. Sclera clear. Palpebral conjunctiva normal red color.Pharynx clear.  NECK: Supple with no palpable mass and no adenopathy.  LUNGS: Sound clear with no rales rhonchi or wheezes.  HEART: Regular rhythm S1 and S2 without murmur.  BREAST: breasts appear normal, no suspicious masses, no skin or nipple changes or axillary nodes.  ABDOMEN: Soft and depressible, nontender with no palpable mass, no hepatomegaly.  EXTREMITIES: Well-developed well-nourished symmetrical with no dependent edema.  NEUROLOGICAL: Awake alert oriented, facial expression symmetrical, moving all extremities.  REVIEW OF DATA: I have reviewed the following data today:      No visits with results within 3 Month(s) from this visit.  Latest known visit with results is:  Ancillary Procedure on 01/15/2018  Component Date Value  . LV Ejection Fraction (%) 01/15/2018 55   . Aortic Valve Regurgitati* 01/15/2018 none   . Aortic Valve Stenosis Gr* 01/15/2018 none   . Aortic Valve Max Velocit* 01/15/2018 1.1   . Aortic Valve Stenosis Me* 01/15/2018 2.6   . Mitral Valve Regurgitati* 01/15/2018 mild   . Mitral Valve Stenosis Gr* 01/15/2018 none   . Tricuspid Valve Regurgit* 01/15/2018 trivial   . Tricuspid Valve Regurgit* 01/15/2018 2.5   . Right Ventricle Systolic*  [id number]   . LV End Diastolic Diamete* A999333 4.6   . LV End Systolic Diameter* A999333 3.2   . LV Septum Wall Thickness* 01/15/2018 0.88   . LV Posterior Wall Thickn* 01/15/2018 0.66   . Left Atrium Diameter (cm) 01/15/2018 3.9     SURGICAL PATHOLOGY CASE: [id number] PATIENT: Orvan Falconer Surgical Pathology Report  Specimen Submitted: A. Breast, right  Clinical History: Suspect benign, possibly cyst. Small right breast mass; Coil-shaped marker deployed following ultrasound guided biopsy of RIGHT breast at 9:30 o'clock, 10 cmfn  DIAGNOSIS: A. BREAST, RIGHT AT 930 O'CLOCK,  10 CM FROM THE NIPPLE; ULTRASOUND-GUIDED CORE BIOPSY: - CYSTIC SPACES CONTAINING PAPILLARY EPITHELIAL PROLIFERATIONS WITH ABSENCE OF MYOEPITHELIAL CELLS, SEE COMMENT. - NEGATIVE FOR INVASIVE CARCINOMA.  Comment: Sections display cystic spaces with a papillary epithelial proliferation showing mild cytologic atypia and a paucity of stromal elements. Immunohistochemical stains were performed. These stains demonstrate an absence of myoepithelial markers calponin and p63. The differential diagnosis includes intraductal papillary carcinoma and encapsulated papillary carcinoma. These entities are considered non-invasive, and there is no evidence of stromal invasion in the tissue present for evaluation.  ASSESSMENT: Ms. Waiters is a 78 y.o. female presenting for consultation for right breast cancer.    Patient was oriented again about the pathology results. Surgical alternatives were discussed with patient including partial vs total mastectomy. Surgical technique and post operative care was discussed with patient. Risk of surgery was discussed with patient including but not limited to: wound infection, seroma, hematoma, brachial plexopathy, mondor's disease (thrombosis of small veins of breast), chronic wound pain, breast lymphedema, altered sensation to the nipple and cosmesis among others.    Since there is a high suspicious of encapsulated, noninvasive carcinoma I recommend right breast needle guided partial mastectomy.  At this moment I do not think that there is any indication for sentinel needle biopsy.  Patient was oriented about the surgical recommendations.  She was oriented that we will send the specimen for gross evaluation of margins.  If the margins are clear with the reexcision.  Also oriented that if the final pathology combined with invasive carcinoma, she will need to have sentinel needle biopsy done.  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 Needle guided partial mastectomy of the right breast (19301) 2. CBC, CMP 3. Avoid taking aspirin 5 days before surgery 4. Contact us if has any question or concern.   Patient verbalized understanding, all questions were answered, and were agreeable with the plan outlined above.   This was a 60-minute encounter most of the time counseling the patient and coordinating plan of care.  Herbert Pun, MD  Electronically signed by Herbert Pun, MD

## 2019-01-21 ENCOUNTER — Encounter
Admission: RE | Admit: 2019-01-21 | Discharge: 2019-01-21 | Disposition: A | Discharge status: Home / Self Care | Payer: Medicare Other | Source: Ambulatory Visit | Attending: General Surgery | Admitting: General Surgery

## 2019-01-21 ENCOUNTER — Other Ambulatory Visit: Payer: Self-pay

## 2019-01-21 DIAGNOSIS — Z20828 Contact with and (suspected) exposure to other viral communicable diseases: Secondary | ICD-10-CM | POA: Diagnosis not present

## 2019-01-21 DIAGNOSIS — Z01818 Encounter for other preprocedural examination: Secondary | ICD-10-CM | POA: Diagnosis present

## 2019-01-21 DIAGNOSIS — I10 Essential (primary) hypertension: Secondary | ICD-10-CM | POA: Insufficient documentation

## 2019-01-21 HISTORY — DX: Unspecified asthma, uncomplicated: J45.909

## 2019-01-21 HISTORY — DX: Cardiac arrhythmia, unspecified: I49.9

## 2019-01-21 HISTORY — DX: Dyspnea, unspecified: R06.00

## 2019-01-21 HISTORY — DX: Gastro-esophageal reflux disease without esophagitis: K21.9

## 2019-01-21 HISTORY — DX: Unspecified macular degeneration: H35.30

## 2019-01-21 HISTORY — DX: Irritable bowel syndrome, unspecified: K58.9

## 2019-01-21 NOTE — Patient Instructions (Signed)
INSTRUCTIONS FOR SURGERY     Your surgery is scheduled for:   Wednesday, October 21ST       When you arrive for surgery, report to the [facility]: Instructions that are not followed completely may result in serious medical risk,  up to and including death, or upon the discretion of your surgeon and anesthesiologist,            your surgery may need to be rescheduled.  __X__ 1. Do not eat food after midnight the night before your procedure.                    No gum, candy, lozenger, tic tacs, tums or hard candies.                  ABSOLUTELY NOTHING SOLID IN YOUR MOUTH AFTER MIDNIGHT                    You may drink unlimited clear liquids up to 2 hours before you are scheduled to arrive for surgery.                   Do not drink anything within those 2 hours unless you need to take medicine, then take the                   smallest amount you need.  Clear liquids include:  water, apple juice without pulp,                   any flavor Gatorade, Black coffee, black tea.  Sugar may be added but no dairy/ honey /lemon.                        Broth and jello is not considered a clear liquid.  __x__  2. On the morning of surgery, please brush your teeth with toothpaste and water. You may rinse with                  mouthwash if you wish but DO NOT SWALLOW TOOTHPASTE OR MOUTHWASH  __X___3. NO alcohol for 24 hours before or after surgery.  __x___ 4.  Do NOT smoke or use e-cigarettes for 24 HOURS PRIOR TO SURGERY.                      DO NOT Use any chewable tobacco products for at least 6 hours prior to surgery.  __x___ 5. If you start any new medication after this appointment and prior to surgery, please                   Bring it with you on the day of surgery.  ___x__ 6. Notify your doctor if there is any change in your medical condition, such as fever, infection, vomitting,                   Diarrhea  or any open sores.  __x___ 7.  USE the CHG SOAP as instructed, the night before surgery and the day of  surgery.                   Once you have washed with this soap, do NOT use any of the following: Powders, perfumes                    or lotions. Please do not wear make up, hairpins, clips or nail polish. You MAY NOT wear deodorant.                   Men may shave their face and neck.  Women need to shave 48 hours prior to surgery.                   DO NOT wear ANY jewelry on the day of surgery. If there are rings that are too tight to                    remove easily, please address this prior to the surgery day. Piercings need to be removed.                                                                     NO METAL ON YOUR BODY.                    Do NOT bring any valuables.  If you came to Pre-Admit testing then you will not need license,                     insurance card or credit card.  If you will be staying overnight, please either leave your things in                     the car or have your family be responsible for these items.                     [facility] IS NOT RESPONSIBLE FOR BELONGINGS OR VALUABLES.  ___X__ 8. DO NOT wear contact lenses on surgery day.  You may not have dentures,                     Hearing aides, contacts or glasses in the operating room. These items can be                    Placed in the Recovery Room to receive immediately after surgery.  __x___ 9. IF YOU ARE SCHEDULED TO GO HOME ON THE SAME DAY, YOU MUST                   Have someone to drive you home and to stay with you  for the first 24 hours.                    Have an arrangement prior to arriving on surgery day.  ___x__ 10. Take the following medications on the morning of surgery with a sip of water:                              1. PROTONIX  2. IMMODIUM                     3. GAS X IF NEEDED                     4.                     5.                     6.  _____  11.  Follow any instructions provided to you by your surgeon.                        Such as enema, clear liquid bowel prep  __X__  12. STOP ASPIRIN AS OF: TODAY                       THIS INCLUDES BC POWDERS / GOODIES POWDER  __x___ 13. STOP Anti-inflammatories as of:   TODAY                      This includes IBUPROFEN / MOTRIN / ADVIL / ALEVE/ NAPROXYN                    YOU MAY TAKE TYLENOL ANY TIME PRIOR TO SURGERY.  __X___ 14.  Stop supplements until after surgery.                     This includes: N/A                 You may continue taking Vitamin B12 / Vitamin D3 but do not take on the morning of surgery.  _____ 15. Bring your CPAP machine into preop with you on the morning of surgery.  ___X___17.  Continue to take the following medications but do not take on the morning of surgery:                        MULTIVITS // VITAMIN D3 //  ______18. If staying overnight, please have appropriate shoes to wear to be able to walk around the unit.                   Wear clean and comfortable clothing to the hospital.  WEAR A SHIRT THAT IS EASY TO GET IN AND OUT OF.  CONTINUE METOPROLOL AT NIGHT

## 2019-01-22 LAB — SARS CORONAVIRUS 2 (TAT 6-24 HRS): SARS Coronavirus 2: NEGATIVE

## 2019-01-26 ENCOUNTER — Ambulatory Visit
Admission: RE | Admit: 2019-01-26 | Discharge: 2019-01-26 | Disposition: A | Discharge status: Home / Self Care | Payer: Medicare Other | Source: Ambulatory Visit | Attending: General Surgery | Admitting: General Surgery

## 2019-01-26 ENCOUNTER — Ambulatory Visit
Admission: RE | Admit: 2019-01-26 | Discharge: 2019-01-26 | Disposition: A | Discharge status: Home / Self Care | Payer: Medicare Other | Attending: General Surgery | Admitting: General Surgery

## 2019-01-26 ENCOUNTER — Other Ambulatory Visit: Payer: Self-pay

## 2019-01-26 ENCOUNTER — Encounter: Payer: Self-pay | Admitting: Anesthesiology

## 2019-01-26 ENCOUNTER — Other Ambulatory Visit: Payer: Self-pay | Admitting: General Surgery

## 2019-01-26 ENCOUNTER — Ambulatory Visit: Payer: Medicare Other | Admitting: Anesthesiology

## 2019-01-26 ENCOUNTER — Encounter
Admission: RE | Disposition: A | Discharge status: Home / Self Care | Payer: Self-pay | Source: Home / Self Care | Attending: General Surgery

## 2019-01-26 DIAGNOSIS — Z85828 Personal history of other malignant neoplasm of skin: Secondary | ICD-10-CM | POA: Insufficient documentation

## 2019-01-26 DIAGNOSIS — Z96653 Presence of artificial knee joint, bilateral: Secondary | ICD-10-CM | POA: Diagnosis not present

## 2019-01-26 DIAGNOSIS — D0511 Intraductal carcinoma in situ of right breast: Secondary | ICD-10-CM

## 2019-01-26 DIAGNOSIS — Z6837 Body mass index (BMI) 37.0-37.9, adult: Secondary | ICD-10-CM | POA: Diagnosis not present

## 2019-01-26 DIAGNOSIS — I10 Essential (primary) hypertension: Secondary | ICD-10-CM | POA: Insufficient documentation

## 2019-01-26 DIAGNOSIS — D493 Neoplasm of unspecified behavior of breast: Secondary | ICD-10-CM | POA: Insufficient documentation

## 2019-01-26 DIAGNOSIS — F419 Anxiety disorder, unspecified: Secondary | ICD-10-CM | POA: Diagnosis not present

## 2019-01-26 DIAGNOSIS — E7800 Pure hypercholesterolemia, unspecified: Secondary | ICD-10-CM | POA: Insufficient documentation

## 2019-01-26 DIAGNOSIS — G43909 Migraine, unspecified, not intractable, without status migrainosus: Secondary | ICD-10-CM | POA: Insufficient documentation

## 2019-01-26 DIAGNOSIS — K580 Irritable bowel syndrome with diarrhea: Secondary | ICD-10-CM | POA: Diagnosis not present

## 2019-01-26 DIAGNOSIS — J309 Allergic rhinitis, unspecified: Secondary | ICD-10-CM | POA: Insufficient documentation

## 2019-01-26 DIAGNOSIS — Z79899 Other long term (current) drug therapy: Secondary | ICD-10-CM | POA: Insufficient documentation

## 2019-01-26 DIAGNOSIS — E669 Obesity, unspecified: Secondary | ICD-10-CM | POA: Diagnosis not present

## 2019-01-26 DIAGNOSIS — K2100 Gastro-esophageal reflux disease with esophagitis, without bleeding: Secondary | ICD-10-CM | POA: Insufficient documentation

## 2019-01-26 DIAGNOSIS — D241 Benign neoplasm of right breast: Secondary | ICD-10-CM | POA: Diagnosis present

## 2019-01-26 HISTORY — PX: PARTIAL MASTECTOMY WITH NEEDLE LOCALIZATION: SHX6008

## 2019-01-26 SURGERY — PARTIAL MASTECTOMY WITH NEEDLE LOCALIZATION
Anesthesia: General | Site: Breast | Laterality: Right

## 2019-01-26 MED ORDER — CEFAZOLIN SODIUM-DEXTROSE 2-4 GM/100ML-% IV SOLN
INTRAVENOUS | Status: AC
  Filled 2019-01-26: qty 100

## 2019-01-26 MED ORDER — PROPOFOL 10 MG/ML IV BOLUS
INTRAVENOUS | Status: AC
  Filled 2019-01-26: qty 40

## 2019-01-26 MED ORDER — FENTANYL CITRATE (PF) 100 MCG/2ML IJ SOLN: 25.0000 ug | INTRAMUSCULAR | Status: DC | PRN

## 2019-01-26 MED ORDER — FENTANYL CITRATE (PF) 100 MCG/2ML IJ SOLN
INTRAMUSCULAR | Status: AC
  Filled 2019-01-26: qty 2

## 2019-01-26 MED ORDER — ONDANSETRON HCL 4 MG/2ML IJ SOLN
INTRAMUSCULAR | Status: DC | PRN
  Administered 2019-01-26: 4 mg via INTRAVENOUS

## 2019-01-26 MED ORDER — LIDOCAINE HCL (CARDIAC) PF 100 MG/5ML IV SOSY
PREFILLED_SYRINGE | INTRAVENOUS | Status: DC | PRN
  Administered 2019-01-26: 100 mg via INTRAVENOUS

## 2019-01-26 MED ORDER — CEFAZOLIN SODIUM-DEXTROSE 2-4 GM/100ML-% IV SOLN
2.0000 g | INTRAVENOUS | Status: AC
  Administered 2019-01-26: 2 g via INTRAVENOUS

## 2019-01-26 MED ORDER — LACTATED RINGERS IV SOLN
INTRAVENOUS | Status: DC | PRN
  Administered 2019-01-26: 10:00:00 via INTRAVENOUS

## 2019-01-26 MED ORDER — FENTANYL CITRATE (PF) 100 MCG/2ML IJ SOLN
INTRAMUSCULAR | Status: DC | PRN
  Administered 2019-01-26 (×2): 25 ug via INTRAVENOUS
  Administered 2019-01-26: 50 ug via INTRAVENOUS

## 2019-01-26 MED ORDER — DEXAMETHASONE SODIUM PHOSPHATE 10 MG/ML IJ SOLN
INTRAMUSCULAR | Status: DC | PRN
  Administered 2019-01-26: 5 mg via INTRAVENOUS

## 2019-01-26 MED ORDER — EPHEDRINE SULFATE 50 MG/ML IJ SOLN
INTRAMUSCULAR | Status: AC
  Filled 2019-01-26: qty 1

## 2019-01-26 MED ORDER — HYDROCODONE-ACETAMINOPHEN 5-325 MG PO TABS: 1.0000 | ORAL_TABLET | ORAL | 0 refills | Status: AC | PRN

## 2019-01-26 MED ORDER — PROPOFOL 10 MG/ML IV BOLUS
INTRAVENOUS | Status: DC | PRN
  Administered 2019-01-26: 150 mg via INTRAVENOUS

## 2019-01-26 MED ORDER — ACETAMINOPHEN 10 MG/ML IV SOLN
INTRAVENOUS | Status: DC | PRN
  Administered 2019-01-26: 1000 mg via INTRAVENOUS

## 2019-01-26 MED ORDER — BUPIVACAINE HCL (PF) 0.5 % IJ SOLN
INTRAMUSCULAR | Status: AC
  Filled 2019-01-26: qty 30

## 2019-01-26 MED ORDER — ONDANSETRON HCL 4 MG/2ML IJ SOLN
INTRAMUSCULAR | Status: AC
  Filled 2019-01-26: qty 2

## 2019-01-26 MED ORDER — EPHEDRINE SULFATE 50 MG/ML IJ SOLN
INTRAMUSCULAR | Status: DC | PRN
  Administered 2019-01-26 (×2): 7.5 mg via INTRAVENOUS

## 2019-01-26 MED ORDER — GLYCOPYRROLATE 0.2 MG/ML IJ SOLN
INTRAMUSCULAR | Status: DC | PRN
  Administered 2019-01-26: 0.2 mg via INTRAVENOUS

## 2019-01-26 MED ORDER — LIDOCAINE HCL (PF) 2 % IJ SOLN
INTRAMUSCULAR | Status: AC
  Filled 2019-01-26: qty 10

## 2019-01-26 MED ORDER — EPINEPHRINE PF 1 MG/ML IJ SOLN
INTRAMUSCULAR | Status: AC
  Filled 2019-01-26: qty 1

## 2019-01-26 MED ORDER — GLYCOPYRROLATE 0.2 MG/ML IJ SOLN
INTRAMUSCULAR | Status: AC
  Filled 2019-01-26: qty 1

## 2019-01-26 MED ORDER — OXYCODONE HCL 5 MG/5ML PO SOLN: 5.0000 mg | Freq: Once | ORAL | Status: DC | PRN

## 2019-01-26 MED ORDER — DEXAMETHASONE SODIUM PHOSPHATE 10 MG/ML IJ SOLN
INTRAMUSCULAR | Status: AC
  Filled 2019-01-26: qty 1

## 2019-01-26 MED ORDER — BUPIVACAINE-EPINEPHRINE 0.5% -1:200000 IJ SOLN
INTRAMUSCULAR | Status: DC | PRN
  Administered 2019-01-26: 30 mL

## 2019-01-26 MED ORDER — ACETAMINOPHEN 10 MG/ML IV SOLN
INTRAVENOUS | Status: AC
  Filled 2019-01-26: qty 100

## 2019-01-26 MED ORDER — OXYCODONE HCL 5 MG PO TABS: 5.0000 mg | ORAL_TABLET | Freq: Once | ORAL | Status: DC | PRN

## 2019-01-26 SURGICAL SUPPLY — 33 items
CANISTER SUCT 1200ML W/VALVE (MISCELLANEOUS) ×2 IMPLANT
CHLORAPREP W/TINT 26 (MISCELLANEOUS) ×2 IMPLANT
COVER WAND RF STERILE (DRAPES) ×2 IMPLANT
DERMABOND ADVANCED (GAUZE/BANDAGES/DRESSINGS) ×1
DERMABOND ADVANCED .7 DNX12 (GAUZE/BANDAGES/DRESSINGS) ×1 IMPLANT
DEVICE DUBIN SPECIMEN MAMMOGRA (MISCELLANEOUS) ×2 IMPLANT
DRAPE LAPAROTOMY 77X122 PED (DRAPES) ×2 IMPLANT
ELECT REM PT RETURN 9FT ADLT (ELECTROSURGICAL) ×2
ELECTRODE REM PT RTRN 9FT ADLT (ELECTROSURGICAL) ×1 IMPLANT
GLOVE BIO SURGEON STRL SZ 6.5 (GLOVE) ×2 IMPLANT
GLOVE BIOGEL PI IND STRL 6.5 (GLOVE) ×1 IMPLANT
GLOVE BIOGEL PI INDICATOR 6.5 (GLOVE) ×1
GOWN STRL REUS W/ TWL LRG LVL3 (GOWN DISPOSABLE) ×2 IMPLANT
GOWN STRL REUS W/TWL LRG LVL3 (GOWN DISPOSABLE) ×2
KIT TURNOVER KIT A (KITS) ×2 IMPLANT
LABEL OR SOLS (LABEL) ×2 IMPLANT
MARGIN MAP 10MM (MISCELLANEOUS) ×2 IMPLANT
NDL HYPO 25X1 1.5 SAFETY (NEEDLE) ×1 IMPLANT
NEEDLE HYPO 25X1 1.5 SAFETY (NEEDLE) ×2 IMPLANT
PACK BASIN MINOR ARMC (MISCELLANEOUS) ×2 IMPLANT
RETRACTOR RING XSMALL (MISCELLANEOUS) IMPLANT
RTRCTR WOUND ALEXIS 13CM XS SH (MISCELLANEOUS) ×2
SLEVE PROBE SENORX GAMMA FIND (MISCELLANEOUS) ×2 IMPLANT
SUT ETHILON 3-0 FS-10 30 BLK (SUTURE) ×2
SUT MNCRL 4-0 (SUTURE) ×1
SUT MNCRL 4-0 27XMFL (SUTURE) ×1
SUT SILK 2 0 SH (SUTURE) IMPLANT
SUT VIC AB 3-0 SH 27 (SUTURE) ×1
SUT VIC AB 3-0 SH 27X BRD (SUTURE) ×1 IMPLANT
SUTURE EHLN 3-0 FS-10 30 BLK (SUTURE) ×1 IMPLANT
SUTURE MNCRL 4-0 27XMF (SUTURE) ×1 IMPLANT
SYR 10ML LL (SYRINGE) ×2 IMPLANT
WATER STERILE IRR 1000ML POUR (IV SOLUTION) ×2 IMPLANT

## 2019-01-26 NOTE — Interval H&P Note (Signed)
History and Physical Interval Note:  01/26/2019 9:39 AM  Lauren Riley  has presented today for surgery, with the diagnosis of D05.11 intraductal papillary carcinoma, Right.  The various methods of treatment have been discussed with the patient and family. After consideration of risks, benefits and other options for treatment, the patient has consented to  Procedure(s): PARTIAL MASTECTOMY WITH NEEDLE LOCALIZATION (Right) as a surgical intervention.  The patient's history has been reviewed, patient examined, no change in status, stable for surgery.  I have reviewed the patient's chart and labs.  Right chest marked in the pre procedure room. Questions were answered to the patient's satisfaction.     Herbert Pun

## 2019-01-26 NOTE — Discharge Instructions (Signed)
AMBULATORY SURGERY  DISCHARGE INSTRUCTIONS   1) The drugs that you were given will stay in your system until tomorrow so for the next 24 hours you should not:  A) Drive an automobile B) Make any legal decisions C) Drink any alcoholic beverage   2) You may resume regular meals tomorrow.  Today it is better to start with liquids and gradually work up to solid foods.  You may eat anything you prefer, but it is better to start with liquids, then soup and crackers, and gradually work up to solid foods.   3) Please notify your doctor immediately if you have any unusual bleeding, trouble breathing, redness and pain at the surgery site, drainage, fever, or pain not relieved by medication.    4) Additional Instructions:        Please contact your physician with any problems or Same Day Surgery at [phone], Monday through Friday 6 am to 4 pm, or  at  Main number at [phone]. Diet: Resume home heart healthy regular diet.   Activity: Increase activity as tolerated. Light activity and walking are encouraged. Do not drive or drink alcohol if taking narcotic pain medications.  Wound care: May shower with soapy water and pat dry (do not rub incisions), but no baths or submerging incision underwater until follow-up. (no swimming)   Medications: Resume all home medications. For mild to moderate pain: acetaminophen (Tylenol) or ibuprofen (if no kidney disease). Combining Tylenol with alcohol can substantially increase your risk of causing liver disease. Narcotic pain medications, if prescribed, can be used for severe pain, though may cause nausea, constipation, and drowsiness. Do not combine Tylenol and Norco within a 6 hour period as Norco contains Tylenol. If you do not need the narcotic pain medication, you do not need to fill the prescription.  Call office ([phone]) at any time if any questions, worsening pain, fevers/chills, bleeding, drainage from incision  site, or other concerns.  

## 2019-01-26 NOTE — Transfer of Care (Signed)
Immediate Anesthesia Transfer of Care Note  Patient: Lauren Riley  Procedure(s) Performed: PARTIAL MASTECTOMY WITH NEEDLE LOCALIZATION (Right Breast)  Patient Location: PACU  Anesthesia Type:General  Level of Consciousness: awake and alert   Airway & Oxygen Therapy: Patient connected to face mask oxygen  Post-op Assessment: Post -op Vital signs reviewed and stable  Post vital signs: stable  Last Vitals:  Vitals Value Taken Time  BP 120/39 01/26/19 1139  Temp    Pulse 81 01/26/19 1139  Resp 25 01/26/19 1139  SpO2 100 % 01/26/19 1139  Vitals shown include unvalidated device data.  Last Pain:  Vitals:   01/26/19 0932  TempSrc: Tympanic  PainSc: 0-No pain         Complications: No apparent anesthesia complications

## 2019-01-26 NOTE — Anesthesia Preprocedure Evaluation (Signed)
Anesthesia Evaluation  Patient identified by MRN, date of birth, ID band Patient awake    Reviewed: Allergy & Precautions, H&P , NPO status , Patient's Chart, lab work & pertinent test results  History of Anesthesia Complications Negative for: history of anesthetic complications  Airway Mallampati: III  TM Distance: <3 FB Neck ROM: limited    Dental  (+) Chipped, Poor Dentition, Missing, Partial Upper   Pulmonary neg shortness of breath, asthma ,           Cardiovascular Exercise Tolerance: Good hypertension, (-) angina(-) Past MI and (-) DOE + dysrhythmias      Neuro/Psych negative neurological ROS  negative psych ROS   GI/Hepatic Neg liver ROS, GERD  Medicated and Controlled,  Endo/Other  negative endocrine ROS  Renal/GU      Musculoskeletal   Abdominal   Peds  Hematology negative hematology ROS (+)   Anesthesia Other Findings Past Medical History: 2015: Anemia     Comment:  has had iron infusions No date: Asthma 01/2019: Breast cancer ([place])     Comment:  unsure if benign or malignant at this point in time No date: Cancer ([place])     Comment:  skin ca No date: Chronic diarrhea No date: Dyspnea     Comment:  with exercise No date: Dysrhythmia No date: GERD (gastroesophageal reflux disease) No date: Hypertension No date: IBS (irritable bowel syndrome) No date: Macular degeneration  Past Surgical History: No date: ABDOMINAL HYSTERECTOMY No date: BREAST BIOPSY; Left     Comment:  unsure which side and between 1974 and 1980 Negative was              a cyst No date: CHOLECYSTECTOMY 1995: COLON SURGERY     Comment:  diverticulitis that had ruptured 1995: COLOSTOMY CLOSURE No date: EYE SURGERY; Bilateral     Comment:  cataract extractions No date: FRACTURE SURGERY     Comment:  femur, ulna and radius. metal removed 2013, 2015: JOINT REPLACEMENT; Bilateral     Comment:  bilateral knee No date:  REPLACEMENT TOTAL KNEE; Bilateral No date: TONSILLECTOMY     Reproductive/Obstetrics negative OB ROS                             Anesthesia Physical Anesthesia Plan  ASA: III  Anesthesia Plan: General LMA   Post-op Pain Management:    Induction: Intravenous  PONV Risk Score and Plan: Dexamethasone, Ondansetron, Midazolam and Treatment may vary due to age or medical condition  Airway Management Planned: LMA  Additional Equipment:   Intra-op Plan:   Post-operative Plan: Extubation in OR  Informed Consent: I have reviewed the patients History and Physical, chart, labs and discussed the procedure including the risks, benefits and alternatives for the proposed anesthesia with the patient or authorized representative who has indicated his/her understanding and acceptance.     Dental Advisory Given  Plan Discussed with: Anesthesiologist, CRNA and Surgeon  Anesthesia Plan Comments: (Patient consented for risks of anesthesia including but not limited to:  - adverse reactions to medications - damage to teeth, lips or other oral mucosa - sore throat or hoarseness - Damage to heart, brain, lungs or loss of life  Patient voiced understanding.)        Anesthesia Quick Evaluation

## 2019-01-26 NOTE — Op Note (Signed)
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   Procedure: Right needle-localized partial mastectomy.                       Anesthesia: GETA  Surgeon: Dr. Windell Moment  Wound Classification: Clean  Indications: Patient is a 78 y.o. 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   Findings: 1. Specimen mammography shows marker and wire on specimen 2. Pathology call refers gross examination of margins was 4 mm from anterior margin 3. No other palpable mass or lymph node identified.   Description of procedure: Preoperative needle localization was performed by radiology.  Localization studies were reviewed. The patient was taken to the operating room and placed supine on the operating table, and after general anesthesia the right chest was prepped and draped in the usual sterile fashion. A time-out was completed verifying correct patient, procedure, site, positioning, and implant(s) and/or special equipment prior to beginning this procedure.  By comparing the localization studies with the direction and skin entry site of the needle, the probable trajectory and location of the mass was visualized. A circumareolar skin incision was planned in such a way as to minimize the amount of dissection to reach the mass.  The skin incision was made. Flaps were raised and the location of the wire confirmed. The wire was delivered into the wound. A 2-0 silk figure-of-eight stay suture was placed around the wire and used for retraction. Dissection was then taken down circumferentially, taking care to include the entire localizing needle and a wide margin of grossly normal tissue. The specimen and entire localizing wire were  removed. The specimen was oriented and sent to radiology with the localization studies. Confirmation was received that the entire target lesion had been resected. The wound was irrigated. Hemostasis was checked. The wound was closed with interrupted sutures of 3-0 Vicryl and a subcuticular suture of Monocryl 4-0. No attempt was made to close the dead space. A dressing was applied.  The patient tolerated the procedure well and was taken to the postanesthesia care unit in stable condition.   Specimen: Right Breast mass   Complications: None  Estimated Blood Loss: 5 mL

## 2019-01-26 NOTE — Anesthesia Procedure Notes (Signed)
Procedure Name: LMA Insertion Date/Time: 01/26/2019 10:08 AM Performed by: Lowry Bowl, CRNA Pre-anesthesia Checklist: Patient identified, Emergency Drugs available, Suction available, Patient being monitored and Timeout performed Patient Re-evaluated:Patient Re-evaluated prior to induction Oxygen Delivery Method: Circle system utilized Preoxygenation: Pre-oxygenation with 100% oxygen Induction Type: IV induction Ventilation: Mask ventilation without difficulty LMA: LMA inserted LMA Size: 3.5 Number of attempts: 1 Placement Confirmation: positive ETCO2 and breath sounds checked- equal and bilateral Tube secured with: Tape Dental Injury: Teeth and Oropharynx as per pre-operative assessment

## 2019-01-26 NOTE — Anesthesia Post-op Follow-up Note (Signed)
Anesthesia QCDR form completed.        

## 2019-01-27 ENCOUNTER — Other Ambulatory Visit: Payer: Self-pay

## 2019-01-27 ENCOUNTER — Encounter: Payer: Self-pay | Admitting: General Surgery

## 2019-01-27 NOTE — Anesthesia Postprocedure Evaluation (Signed)
Anesthesia Post Note  Patient: Lauren Riley  Procedure(s) Performed: PARTIAL MASTECTOMY WITH NEEDLE LOCALIZATION (Right Breast)  Patient location during evaluation: PACU Anesthesia Type: General Level of consciousness: awake and alert Pain management: pain level controlled Vital Signs Assessment: post-procedure vital signs reviewed and stable Respiratory status: spontaneous breathing, nonlabored ventilation and respiratory function stable Cardiovascular status: blood pressure returned to baseline and stable Postop Assessment: no apparent nausea or vomiting Anesthetic complications: no     Last Vitals:  Vitals:   01/26/19 1210 01/26/19 1224  BP: (!) 128/58 (!) 128/53  Pulse: 79 70  Resp: 16 18  Temp: 36.7 C 36.4 C  SpO2: 96% 100%    Last Pain:  Vitals:   01/26/19 1224  TempSrc: Tympanic  PainSc:                  Alphonsus Sias

## 2019-01-31 LAB — SURGICAL PATHOLOGY

## 2019-02-02 ENCOUNTER — Other Ambulatory Visit: Payer: Self-pay

## 2019-02-03 ENCOUNTER — Inpatient Hospital Stay: Payer: Medicare Other | Attending: Oncology | Admitting: Oncology

## 2019-02-03 ENCOUNTER — Other Ambulatory Visit: Payer: Self-pay

## 2019-02-03 ENCOUNTER — Encounter: Payer: Self-pay | Admitting: *Deleted

## 2019-02-03 ENCOUNTER — Encounter: Payer: Self-pay | Admitting: Oncology

## 2019-02-03 ENCOUNTER — Other Ambulatory Visit: Payer: Self-pay | Admitting: Oncology

## 2019-02-03 ENCOUNTER — Other Ambulatory Visit: Payer: Self-pay | Admitting: *Deleted

## 2019-02-03 VITALS — BP 163/64 | HR 73 | Temp 98.3°F | Resp 16 | Wt 194.1 lb

## 2019-02-03 DIAGNOSIS — K219 Gastro-esophageal reflux disease without esophagitis: Secondary | ICD-10-CM | POA: Insufficient documentation

## 2019-02-03 DIAGNOSIS — D0511 Intraductal carcinoma in situ of right breast: Secondary | ICD-10-CM | POA: Insufficient documentation

## 2019-02-03 DIAGNOSIS — Z7189 Other specified counseling: Secondary | ICD-10-CM

## 2019-02-03 DIAGNOSIS — Z170 Estrogen receptor positive status [ER+]: Secondary | ICD-10-CM | POA: Insufficient documentation

## 2019-02-03 DIAGNOSIS — I10 Essential (primary) hypertension: Secondary | ICD-10-CM | POA: Insufficient documentation

## 2019-02-03 MED ORDER — ANASTROZOLE 1 MG PO TABS: 1.0000 mg | ORAL_TABLET | Freq: Every day | ORAL | 1 refills | Status: DC

## 2019-02-03 NOTE — Progress Notes (Signed)
Patient stated that she had her mammogram in December 31, 2018 and they found something. Then she had a biopsy on 01/12/2019 that lead for her to see Dr. Annamary Rummage and he did a partial mastectomy of her right breast on 01/26/2019. Patient stated that since she has lumpy breasts, she would not do monthly breast exams. Patient stated that she has tenderness on her right breast since surgery. Patient denied nipple discharge and skin discoloration.

## 2019-02-04 ENCOUNTER — Other Ambulatory Visit: Payer: Self-pay | Admitting: *Deleted

## 2019-02-04 ENCOUNTER — Encounter: Payer: Self-pay | Admitting: Oncology

## 2019-02-04 DIAGNOSIS — C50911 Malignant neoplasm of unspecified site of right female breast: Secondary | ICD-10-CM

## 2019-02-04 DIAGNOSIS — Z7189 Other specified counseling: Secondary | ICD-10-CM | POA: Insufficient documentation

## 2019-02-04 DIAGNOSIS — Z170 Estrogen receptor positive status [ER+]: Secondary | ICD-10-CM

## 2019-02-04 DIAGNOSIS — D0511 Intraductal carcinoma in situ of right breast: Secondary | ICD-10-CM | POA: Insufficient documentation

## 2019-02-04 NOTE — Progress Notes (Signed)
Hematology/Oncology Consult note [facility] Telephone:([phone] [phone]  Patient Care Team: Barbaraann Boys, MD as PCP - General (Pediatrics)   Name of the patient: Lauren Riley  [id number]  05/05/1940    Reason for referral- intraductal papillary carcinoma   Referring physician- Dr. Windell Moment  Date of visit: 02/04/19   History of presenting illness-patient is a 78 year old female who underwent a screening mammogram in September 2020 which showed a suspicious 9 mm mass in the right breast 9:30 position.  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  She was seen by Dr. 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  No evidence of invasive carcinoma.  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  Patient has been sent to [place] for further recommendations  Patient attained menopause over 25 years ago.  She briefly used birth control and hormone replacement therapy but could not continue it due to GI side effects.  Family history of breast cancer in her sister.  ECOG PS- 1  Pain scale- 0   Review of systems- Review of Systems  Constitutional: Positive for malaise/fatigue. Negative for chills, fever and weight loss.  HENT: Negative for congestion, ear discharge and nosebleeds.   Eyes: Negative for blurred vision.  Respiratory: Negative for cough, hemoptysis, sputum production, shortness of breath and wheezing.   Cardiovascular: Negative for chest pain, palpitations, orthopnea and claudication.  Gastrointestinal: Negative for abdominal pain, blood in stool, constipation, diarrhea, heartburn, melena, nausea and vomiting.  Genitourinary: Negative for dysuria, flank pain, frequency,  hematuria and urgency.  Musculoskeletal: Negative for back pain, joint pain and myalgias.  Skin: Negative for rash.  Neurological: Negative for dizziness, tingling, focal weakness, seizures, weakness and headaches.  Endo/Heme/Allergies: Does not bruise/bleed easily.  Psychiatric/Behavioral: Negative for depression and suicidal ideas. The patient does not have insomnia.     Allergies  Allergen Reactions   Nsaids     Other reaction(s):  GI UPSET; CHEST BURNING FROM IBUPROFEN    Fenofibrate     Liver Disorder Elevated LFT's    Fish Oil Diarrhea    GI upset    Niacin     Flushing    Pravastatin Other (See Comments)    Mylagias    Tape Rash    ADHESIVE TAPE- OK W/ PAPER TAPES. ADHESIVE TAPE- OK W/ PAPER TAPES    Patient Active Problem List   Diagnosis Date Noted   GERD (gastroesophageal reflux disease) 02/03/2019   Hypertension 02/03/2019   Heart murmur 01/07/2018   Left hand pain 10/22/2017   Radial styloid tenosynovitis (de quervain) 10/22/2017   Chronic pain of right wrist 01/22/2017   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 01/22/2017   Migraine without aura and without status migrainosus, not intractable 01/16/2017   Chronic seasonal allergic rhinitis 03/06/2016   Gastroesophageal reflux disease with esophagitis 03/06/2016   Hypertension, well controlled 03/06/2016   Claustrophobia 12/30/2014   Diverticulitis of large intestine without perforation or abscess without bleeding 11/18/2014   Hypertriglyceridemia 09/15/2014   Need for vaccination with 13-polyvalent pneumococcal conjugate vaccine 09/15/2014   Rash 09/15/2014   Obesity (BMI 35.0-39.9 without comorbidity) 02/23/2014   Anxiety 12/30/2013   Irritable bowel syndrome with diarrhea 12/30/2013   Macular degeneration disease 06/27/2013   Vitamin D deficiency 06/27/2013   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/17/2011  Anemia 10/21/2011     Past Medical History:  Diagnosis  Date   Anemia 2015   has had iron infusions   Asthma    Breast cancer ([place]) 01/2019   unsure if benign or malignant at this point in time   Cancer [facility])    skin ca   Chronic diarrhea    Dyspnea    with exercise   Dysrhythmia    GERD (gastroesophageal reflux disease)    Hypertension    IBS (irritable bowel syndrome)    Macular degeneration      Past Surgical History:  Procedure Laterality Date   ABDOMINAL HYSTERECTOMY     BREAST BIOPSY Left    unsure which side and between 1974 and 1980 Negative was a cyst   BREAST BIOPSY Right 01/12/2019   intraductal papilloma   BREAST LUMPECTOMY Right 01/26/2019`   intraductal papilloma excisied.    [place]   diverticulitis that had ruptured   COLOSTOMY CLOSURE  1995   EYE SURGERY Bilateral    cataract extractions   FRACTURE SURGERY     femur, ulna and radius. metal removed   JOINT REPLACEMENT Bilateral 2013, 2015   bilateral knee   PARTIAL MASTECTOMY WITH NEEDLE LOCALIZATION Right 01/26/2019   Procedure: PARTIAL MASTECTOMY WITH NEEDLE LOCALIZATION;  Surgeon: Herbert Pun, MD;  Location: [facility] ORS;  Service: General;  Laterality: Right;   REPLACEMENT TOTAL KNEE Bilateral    TONSILLECTOMY      Social History   Socioeconomic History   Marital status: Married    Spouse name: Dan   Number of children: Not on file   Years of education: Not on file   Highest education level: Not on file  Occupational History   Occupation: school Pharmacist, hospital    Comment: retired  Scientist, product/process development strain: Not on file   Food insecurity    Worry: Not on file    Inability: Not on Lexicographer needs    Medical: Not on file    Non-medical: Not on file  Tobacco Use   Smoking status: Never Smoker   Smokeless tobacco: Never Used  Substance and Sexual Activity   Alcohol use: Never    Frequency: Never   Drug use: Never   Sexual activity: Not on file    Lifestyle   Physical activity    Days per week: Not on file    Minutes per session: Not on file   Stress: Not on file  Relationships   Social connections    Talks on phone: Not on file    Gets together: Not on file    Attends religious service: Not on file    Active member of club or organization: Not on file    Attends meetings of clubs or organizations: Not on file    Relationship status: Not on file   Intimate partner violence    Fear of current or ex partner: Not on file    Emotionally abused: Not on file    Physically abused: Not on file    Forced sexual activity: Not on file  Other Topics Concern   Not on file  Social History Narrative   Recently transplanted to live near son and his family.   One daughter died in an MVA that patient was also in.     Family History  Problem Relation Age of Onset   Breast cancer Sister 53   Breast cancer Maternal Aunt  mat aunt   Parkinson's disease Mother    Congestive Heart Failure Father    Hypertension Father      Current Outpatient Medications:    chlorpheniramine (CHLOR-TRIMETON) 4 MG tablet, Take 4 mg by mouth daily., Disp: , Rfl:    Cholecalciferol (VITAMIN D3) 50 MCG (2000 UT) capsule, Take 2,000 Units by mouth daily. , Disp: , Rfl:    loperamide (IMODIUM A-D) 2 MG tablet, Take 2 mg by mouth 3 (three) times daily as needed for diarrhea or loose stools. , Disp: , Rfl:    metoprolol succinate (TOPROL-XL) 100 MG 24 hr tablet, Take 100 mg by mouth daily. , Disp: , Rfl:    Multiple Vitamins-Minerals (PRESERVISION AREDS 2) CAPS, Take 1 capsule by mouth 2 (two) times daily. , Disp: , Rfl:    pantoprazole (PROTONIX) 40 MG tablet, Take 40 mg by mouth daily. , Disp: , Rfl:    SUMAtriptan (IMITREX) 50 MG tablet, Take by mouth., Disp: , Rfl:    anastrozole (ARIMIDEX) 1 MG tablet, TAKE 1 TABLET(1 MG) BY MOUTH DAILY, Disp: 90 tablet, Rfl: 0   Physical exam:  Vitals:   02/03/19 1426  BP: (!) 163/64  Pulse:  73  Resp: 16  Temp: 98.3 F (36.8 C)  TempSrc: Temporal  SpO2: 97%  Weight: 194 lb 1.6 oz (88 kg)   Physical Exam HENT:     Head: Normocephalic and atraumatic.  Eyes:     Pupils: Pupils are equal, round, and reactive to light.  Neck:     Musculoskeletal: Normal range of motion.  Cardiovascular:     Rate and Rhythm: Normal rate and regular rhythm.     Heart sounds: Normal heart sounds.  Pulmonary:     Effort: Pulmonary effort is normal.     Breath sounds: Normal breath sounds.  Abdominal:     General: Bowel sounds are normal.     Palpations: Abdomen is soft.  Skin:    General: Skin is warm and dry.  Neurological:     Mental Status: She is alert and oriented to person, place, and time.     Breast exam: Patient is s/p right lumpectomy with a well-healed surgical scar.  No palpable bilateral axillary adenopathy.   No flowsheet data found. No flowsheet data found.  No images are attached to the encounter.  Mm Breast Surgical Specimen  Result Date: 01/26/2019 CLINICAL DATA:  Specimen radiograph status post right breast lumpectomy. EXAM: SPECIMEN RADIOGRAPH OF THE RIGHT BREAST COMPARISON:  Previous exam(s). FINDINGS: Status post excision of the right breast. The wire tip and biopsy marker clip are present and are marked for pathology. These findings were communicated to Dr. Windell Moment in the OR at 1102am. IMPRESSION: Specimen radiograph of the right breast. Electronically Signed   By: Ammie Ferrier M.D.   On: 01/26/2019 11:03   Mm Clip Placement Right  Result Date: 01/12/2019 CLINICAL DATA:  Status post ultrasound-guided core needle biopsy of a right breast mass. EXAM: DIAGNOSTIC RIGHT MAMMOGRAM POST ULTRASOUND BIOPSY COMPARISON:  Previous exam(s). FINDINGS: Mammographic images were obtained following ultrasound guided biopsy of a right breast mass. The coil shaped biopsy clip lies in the expected location of the mass. Mass is no longer visualized. IMPRESSION:  Well-positioned coil shaped biopsy clip following ultrasound-guided core needle biopsy of the right breast. Final Assessment: Post Procedure Mammograms for Marker Placement Electronically Signed   By: Lajean Manes M.D.   On: 01/12/2019 14:52   [place] Rt Breast Bx W Loc Dev 1st  Lesion Img Bx Spec US Guide  Addendum Date: 01/19/2019   ADDENDUM REPORT: 01/17/2019 09:32 ADDENDUM: PATHOLOGY revealed: DIAGNOSIS: A. BREAST, RIGHT AT 930 O'CLOCK, 10 CM FROM THE NIPPLE; - CYSTIC SPACES CONTAINING PAPILLARY EPITHELIAL PROLIFERATIONS WITH ABSENCE OF MYOEPITHELIAL CELLS, SEE COMMENT. - NEGATIVE FOR INVASIVE CARCINOMA. Comment: Sections display cystic spaces with a papillary epithelial proliferation showing mild cytologic atypia and a paucity of stromal elements. Immunohistochemical stains were performed. These stains demonstrate an absence of myoepithelial markers calponin and p63. The differential diagnosis includes intraductal papillary carcinoma and encapsulated papillary carcinoma. These entities are considered non-invasive, and there is no evidence of stromal invasion in the tissue present for evaluation. Ancillary ER testing is pending, and will be reported as an addendum. Pathology results are CONCORDANT with imaging findings, per Dr. Lajean Manes. Pathology results were discussed with patient via telephone. The patient reported doing well after the biopsy with no adverse symptoms, only tenderness at the site. Post biopsy care instructions were reviewed and questions were answered. The patient was encouraged to call [facility] for any additional questions or concerns. Recommendation: Surgical referral. Request for surgical referral was relayed to nurse navigators at [facility] by Electa Sniff RN on 01/14/2019. Addendum by Electa Sniff RN on 01/17/2019. Electronically Signed   By: Lajean Manes M.D.   On: 01/17/2019 09:32   Result Date: 01/19/2019 CLINICAL DATA:  Patient presents for  ultrasound-guided core needle biopsy of the right breast, for a small mass in the 9:30 o'clock position. EXAM: ULTRASOUND GUIDED RIGHT BREAST CORE NEEDLE BIOPSY COMPARISON:  Previous exam(s). FINDINGS: I met with the patient and we discussed the procedure of ultrasound-guided biopsy, including benefits and alternatives. We discussed the high likelihood of a successful procedure. We discussed the risks of the procedure, including infection, bleeding, tissue injury, clip migration, and inadequate sampling. Informed written consent was given. The usual time-out protocol was performed immediately prior to the procedure. Lesion quadrant: Upper outer quadrant Using sterile technique and 1% Lidocaine as local anesthetic, under direct ultrasound visualization, a 12 gauge spring-loaded device was used to perform biopsy of the small mass in the 9:30 o'clock position of the right breast, 10 cm the nipple, using an inferior approach. At the conclusion of the procedure coil shaped tissue marker clip was deployed into the biopsy cavity. Follow up 2 view mammogram was performed and dictated separately. IMPRESSION: Ultrasound guided biopsy of the right breast. No apparent complications. Electronically Signed: By: Lajean Manes M.D. On: 01/12/2019 14:51   Mm Rt Plc Breast Loc Dev   1st Lesion  Inc Mammo Guide  Result Date: 01/26/2019 CLINICAL DATA:  78 year old female presenting for wire localization of the right breast prior to lumpectomy. EXAM: NEEDLE LOCALIZATION OF THE RIGHT BREAST WITH MAMMO GUIDANCE COMPARISON:  Previous exams. FINDINGS: Patient presents for needle localization prior to right breast lumpectomy. I met with the patient and we discussed the procedure of needle localization including benefits and alternatives. We discussed the high likelihood of a successful procedure. We discussed the risks of the procedure, including infection, bleeding, tissue injury, and further surgery. Informed, written consent was  given. The usual time-out protocol was performed immediately prior to the procedure. Using mammographic guidance, sterile technique, 1% lidocaine and a not modified Kopans needle, the coil shaped biopsy marking clip in the lateral right breast was localized using lateral approach. The images were marked for Dr. Windell Moment. IMPRESSION: Needle localization right breast. No apparent complications. Electronically Signed   By: Sharyn Lull  Theda Sers M.D.   On: 01/26/2019 09:07    Assessment and plan- Patient is a 78 y.o. female with newly diagnosed ER positive intraductal papillary carcinoma (DCIS)  Discussed results of mammogram and final pathology with the patient in detail.  This is a small 9 mm low-grade intraductal papillary carcinoma without any invasive carcinoma.  I will discuss her case at tumor board of adjuvant radiation treatment would be beneficial given her age.  Given that this is ER positive it would be reasonable to consider hormone therapy for 5 years.  Discussed that both tamoxifen or AI's would be reasonable.  Discussed risks and benefits of Arimidex including all but not limited to fatigue, hot flashes, mood swings, arthralgias and worsening bone health as well as hypercholesterolemia.  I will obtain a baseline bone density scan at this time.  Patient is willing to consider hormone therapy but is concerned about possible GI side effects which are typically not that common.  Written information about Arimidex has been given to the patient.  She will also need to take calcium 1200 mg along with vitamin D 800 international units.  Treatment is being given with a curative intent.  I will see her back in 6 weeks time with a CMP to see how she is tolerating her AI   Thank you for this kind referral and the opportunity to participate in the care of this  Patient   Visit Diagnosis 1. Ductal carcinoma in situ (DCIS) of right breast   2. Goals of care, counseling/discussion     Dr. Randa Evens,  MD, MPH [facility] at [facility] [phone] 02/04/2019  8:21 AM

## 2019-02-04 NOTE — Progress Notes (Signed)
Met patient and her husband during her initial medical oncology consult with Dr. Janese Banks.  Gave patient breast cancer educational literature, "My Breast Cancer Treatment Handbook" by Josephine Igo, RN.  She is to call with any questions or needs.

## 2019-02-07 ENCOUNTER — Encounter: Payer: Self-pay | Admitting: Oncology

## 2019-02-07 ENCOUNTER — Encounter: Payer: Self-pay | Admitting: *Deleted

## 2019-02-07 NOTE — Progress Notes (Signed)
Patient called with questions regarding upcoming appointment on mychart for new patient visit with Dr. Janese Banks on Friday, but was seen last Friday.  She has a lot of questions and concerns regarding taking the Anastrozole.  She is concerned it will worsen her macular degeneration, and cause more joint and muscle pain.  Did review side effects, and that I was not aware of macular degeneration changes.  I did recommend that she discuss with her eye specialist and Dr. Janese Banks.  Offered to schedule her to come in to discuss further.  She has not started the medication as of today.  She is to let me know her plan, so I can inform Dr. Janese Banks.  She is agreeable to call me back.

## 2019-02-10 NOTE — Telephone Encounter (Signed)
Video visit next week tuesday

## 2019-02-11 ENCOUNTER — Ambulatory Visit: Payer: Medicare Other | Admitting: Oncology

## 2019-02-11 ENCOUNTER — Telehealth: Payer: Self-pay | Admitting: *Deleted

## 2019-02-11 NOTE — Telephone Encounter (Signed)
Called pt to let her know that she will have video visit on tues and after visit and Dr. Janese Banks speaks to her then she will decide about radiation appt or not.. pt agreeable to this

## 2019-02-14 ENCOUNTER — Inpatient Hospital Stay: Payer: Medicare Other | Attending: Oncology

## 2019-02-14 ENCOUNTER — Other Ambulatory Visit: Payer: Medicare Other

## 2019-02-15 ENCOUNTER — Inpatient Hospital Stay (HOSPITAL_BASED_OUTPATIENT_CLINIC_OR_DEPARTMENT_OTHER): Payer: Medicare Other | Admitting: Oncology

## 2019-02-15 ENCOUNTER — Other Ambulatory Visit: Payer: Self-pay

## 2019-02-15 ENCOUNTER — Encounter: Payer: Self-pay | Admitting: Oncology

## 2019-02-15 DIAGNOSIS — D0511 Intraductal carcinoma in situ of right breast: Secondary | ICD-10-CM | POA: Diagnosis not present

## 2019-02-15 NOTE — Progress Notes (Signed)
Patient would like to her lab results.

## 2019-02-16 ENCOUNTER — Telehealth: Payer: Self-pay | Admitting: *Deleted

## 2019-02-16 NOTE — Telephone Encounter (Signed)
Pt called to say she will start the anastrozole tom.am and she needs a later am appt for her f/u appt on jan 5. I changed it to 11 am for labs and 11:30 for see md. Pt is agreeable

## 2019-02-17 NOTE — Progress Notes (Signed)
I connected with Lauren Riley on 02/17/19 at  2:15 PM EST by video enabled telemedicine visit and verified that I am speaking with the correct person using two identifiers.   I discussed the limitations, risks, security and privacy concerns of performing an evaluation and management service by telemedicine and the availability of in-person appointments. I also discussed with the patient that there may be a patient responsible charge related to this service. The patient expressed understanding and agreed to proceed.  Other persons participating in the visit and their role in the encounter:  none  Patient's location:  home Provider's location:  work  Risk analyst Complaint:  Discuss side effects of endocrine therapy  Diagnosis: intraductal papillary carcinoma right breast ER +  History of present illness: patient is a 78 year old female who underwent a screening mammogram in September 2020 which showed a suspicious 9 mm mass in the right breast 9:30 position.  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  She was seen by Dr. 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  No evidence of invasive carcinoma.  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    Interval history: Patient is yet to start taking her Arimidex.  She continues to have concerns about possible GI side effects as well as arthritis and cardiovascular side effects.   Review of Systems  Constitutional: Positive for malaise/fatigue. Negative for chills, fever and weight loss.  HENT: Negative for congestion, ear discharge and nosebleeds.   Eyes: Negative for blurred vision.  Respiratory: Negative for cough, hemoptysis, sputum production, shortness of breath and wheezing.    Cardiovascular: Negative for chest pain, palpitations, orthopnea and claudication.  Gastrointestinal: Negative for abdominal pain, blood in stool, constipation, diarrhea, heartburn, melena, nausea and vomiting.  Genitourinary: Negative for dysuria, flank pain, frequency, hematuria and urgency.  Musculoskeletal: Negative for back pain, joint pain and myalgias.  Skin: Negative for rash.  Neurological: Negative for dizziness, tingling, focal weakness, seizures, weakness and headaches.  Endo/Heme/Allergies: Does not bruise/bleed easily.  Psychiatric/Behavioral: Negative for depression and suicidal ideas. The patient does not have insomnia.     Allergies  Allergen Reactions  . Nsaids     Other reaction(s):  GI UPSET; CHEST BURNING FROM IBUPROFEN   . Fenofibrate     Liver Disorder Elevated LFT's   . Fish Oil Diarrhea    GI upset   . Niacin     Flushing   . Pravastatin Other (See Comments)    Mylagias   . Tape Rash    ADHESIVE TAPE- OK W/ PAPER TAPES. ADHESIVE TAPE- OK W/ PAPER TAPES    Past Medical History:  Diagnosis Date  . Anemia 2015   has had iron infusions  . Asthma   . Breast cancer ([place]) 01/2019   unsure if benign or malignant at this point in time  . Cancer ([place])    skin ca  . Chronic diarrhea   . Dyspnea    with exercise  . Dysrhythmia   . GERD (gastroesophageal reflux disease)   . Hypertension   . IBS (irritable bowel syndrome)   . Macular degeneration     Past Surgical History:  Procedure Laterality Date  . ABDOMINAL HYSTERECTOMY    . BREAST BIOPSY Left    unsure which side and between 1974 and 1980 Negative was a cyst  .  BREAST BIOPSY Right 01/12/2019   intraductal papilloma  . BREAST LUMPECTOMY Right 01/26/2019`   intraductal papilloma excisied.   . CHOLECYSTECTOMY    . [place]   diverticulitis that had ruptured  . COLOSTOMY CLOSURE  1995  . EYE SURGERY Bilateral    cataract extractions  . FRACTURE SURGERY     femur, ulna and  radius. metal removed  . JOINT REPLACEMENT Bilateral 2013, 2015   bilateral knee  . PARTIAL MASTECTOMY WITH NEEDLE LOCALIZATION Right 01/26/2019   Procedure: PARTIAL MASTECTOMY WITH NEEDLE LOCALIZATION;  Surgeon: Herbert Pun, MD;  Location: [facility] ORS;  Service: General;  Laterality: Right;  . REPLACEMENT TOTAL KNEE Bilateral   . TONSILLECTOMY      Social History   Socioeconomic History  . Marital status: Married    Spouse name: Linna Hoff  . Number of children: Not on file  . Years of education: Not on file  . Highest education level: Not on file  Occupational History  . 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 Financial resource strain: Not on file  . Food insecurity    Worry: Not on file    Inability: Not on file  . Transportation needs    Medical: Not on file    Non-medical: Not on file  Tobacco Use  . Smoking status: Never Smoker  . Smokeless tobacco: Never Used  Substance and Sexual Activity  . Alcohol use: Never    Frequency: Never  . Drug use: Never  . Sexual activity: Not on file  Lifestyle  . Physical activity    Days per week: Not on file    Minutes per session: Not on file  . Stress: Not on file  Relationships  . Social Herbalist on phone: Not on file    Gets together: Not on file    Attends religious service: Not on file    Active member of club or organization: Not on file    Attends meetings of clubs or organizations: Not on file    Relationship status: Not on file  . Intimate partner violence    Fear of current or ex partner: Not on file    Emotionally abused: Not on file    Physically abused: Not on file    Forced sexual activity: Not on file  Other Topics Concern  . Not on file  Social History Narrative   Recently transplanted to live near son and his family.   One daughter died in an MVA that patient was also in.    Family History  Problem Relation Age of Onset  . Breast cancer Sister 69  . Breast  cancer Maternal Aunt        mat aunt  . Parkinson's disease Mother   . Congestive Heart Failure Father   . Hypertension Father      Current Outpatient Medications:  .  anastrozole (ARIMIDEX) 1 MG tablet, TAKE 1 TABLET(1 MG) BY MOUTH DAILY, Disp: 90 tablet, Rfl: 0 .  chlorpheniramine (CHLOR-TRIMETON) 4 MG tablet, Take 4 mg by mouth daily., Disp: , Rfl:  .  Cholecalciferol (VITAMIN D3) 50 MCG (2000 UT) capsule, Take 2,000 Units by mouth daily. , Disp: , Rfl:  .  loperamide (IMODIUM A-D) 2 MG tablet, Take 2 mg by mouth 3 (three) times daily as needed for diarrhea or loose stools. , Disp: , Rfl:  .  metoprolol succinate (TOPROL-XL) 100 MG 24 hr tablet, Take 100 mg  by mouth daily. , Disp: , Rfl:  .  Multiple Vitamins-Minerals (PRESERVISION AREDS 2) CAPS, Take 1 capsule by mouth 2 (two) times daily. , Disp: , Rfl:  .  pantoprazole (PROTONIX) 40 MG tablet, Take 40 mg by mouth daily. , Disp: , Rfl:  .  SUMAtriptan (IMITREX) 50 MG tablet, Take by mouth., Disp: , Rfl:   Mm Breast Surgical Specimen  Result Date: 01/26/2019 CLINICAL DATA:  Specimen radiograph status post right breast lumpectomy. EXAM: SPECIMEN RADIOGRAPH OF THE RIGHT BREAST COMPARISON:  Previous exam(s). FINDINGS: Status post excision of the right breast. The wire tip and biopsy marker clip are present and are marked for pathology. These findings were communicated to Dr. Windell Moment in the OR at 1102am. IMPRESSION: Specimen radiograph of the right breast. Electronically Signed   By: Ammie Ferrier M.D.   On: 01/26/2019 11:03   Mm Rt Plc Breast Loc Dev   1st Lesion  Inc Mammo Guide  Result Date: 01/26/2019 CLINICAL DATA:  78 year old female presenting for wire localization of the right breast prior to lumpectomy. EXAM: NEEDLE LOCALIZATION OF THE RIGHT BREAST WITH MAMMO GUIDANCE COMPARISON:  Previous exams. FINDINGS: Patient presents for needle localization prior to right breast lumpectomy. I met with the patient and we discussed the  procedure of needle localization including benefits and alternatives. We discussed the high likelihood of a successful procedure. We discussed the risks of the procedure, including infection, bleeding, tissue injury, and further surgery. Informed, written consent was given. The usual time-out protocol was performed immediately prior to the procedure. Using mammographic guidance, sterile technique, 1% lidocaine and a not modified Kopans needle, the coil shaped biopsy marking clip in the lateral right breast was localized using lateral approach. The images were marked for Dr. Windell Moment. IMPRESSION: Needle localization right breast. No apparent complications. Electronically Signed   By: Ammie Ferrier M.D.   On: 01/26/2019 09:07    No images are attached to the encounter.   No flowsheet data found. No flowsheet data found.   Observation/objective: Appears in no acute distress of a video visit today.  Breathing is nonlabored  Assessment and plan: Patient is a 78 year old female with intraductal papillary carcinoma ER positive of the right breast status post lumpectomy  We discussed patient's case at tumor board yesterday.  Patient has low-grade 5 mm intraductal papillary carcinoma.  I did ask if radiation oncology thinks that she would be in need of any adjuvant radiation and they agree that it will not be necessary given her age and low-grade pathology.  I did discuss the [facility] DCIS algorithm which shows her 5 and 10-year risk of recurrence with surgery alone is 7% and 11% respectively which comes down to 3% 5% with hormone therapy.Did discuss prior cardiovascular parameters such as hypertension can be worse with aromatase inhibitors.  GI distress has been reported 10 2934% patient's.  Other side effects include mood swings, arthralgias and hot flashes.  Some women do not have any of these he feels better some others are not able to tolerate AI.  Patient would like to think about risks versus  benefits and let us know if she decides to take Arimidex.  She does have a bone density scan coming up.  Follow-up instructions: I will see her back first week of January with CMP at that time to see how she is tolerating her Arimidex  I discussed the assessment and treatment plan with the patient. The patient was provided an opportunity to ask questions and all were  answered. The patient agreed with the plan and demonstrated an understanding of the instructions.   The patient was advised to call back or seek an in-person evaluation if the symptoms worsen or if the condition fails to improve as anticipated.   Visit Diagnosis: 1. Ductal carcinoma in situ (DCIS) of right breast     Dr. Randa Evens, MD, MPH [facility] Pager- [phone] 02/17/2019 1:25 PM

## 2019-02-23 ENCOUNTER — Encounter: Payer: Self-pay | Admitting: Oncology

## 2019-02-24 ENCOUNTER — Ambulatory Visit
Admission: RE | Admit: 2019-02-24 | Discharge: 2019-02-24 | Disposition: A | Discharge status: Home / Self Care | Payer: Medicare Other | Source: Ambulatory Visit | Attending: Oncology | Admitting: Oncology

## 2019-02-24 DIAGNOSIS — Z13820 Encounter for screening for osteoporosis: Secondary | ICD-10-CM | POA: Insufficient documentation

## 2019-02-24 DIAGNOSIS — Z780 Asymptomatic menopausal state: Secondary | ICD-10-CM | POA: Diagnosis not present

## 2019-02-24 DIAGNOSIS — D0511 Intraductal carcinoma in situ of right breast: Secondary | ICD-10-CM | POA: Diagnosis present

## 2019-02-24 DIAGNOSIS — M8580 Other specified disorders of bone density and structure, unspecified site: Secondary | ICD-10-CM | POA: Insufficient documentation

## 2019-03-17 ENCOUNTER — Telehealth: Payer: Self-pay | Admitting: *Deleted

## 2019-03-17 NOTE — Telephone Encounter (Signed)
Called and spoke to pt. And cancer policy form is done and ihave copied notes, mammograms, and pathology report. She has itemized list from hospital. She wants the info mailed to her and I put it in medical records to be mailed

## 2019-03-18 ENCOUNTER — Ambulatory Visit: Payer: Medicare Other | Admitting: Oncology

## 2019-03-18 ENCOUNTER — Other Ambulatory Visit: Payer: Medicare Other

## 2019-04-11 ENCOUNTER — Other Ambulatory Visit: Payer: Self-pay

## 2019-04-11 ENCOUNTER — Inpatient Hospital Stay: Payer: Medicare PPO | Attending: Oncology

## 2019-04-11 DIAGNOSIS — Z79811 Long term (current) use of aromatase inhibitors: Secondary | ICD-10-CM | POA: Insufficient documentation

## 2019-04-11 DIAGNOSIS — Z170 Estrogen receptor positive status [ER+]: Secondary | ICD-10-CM | POA: Diagnosis not present

## 2019-04-11 DIAGNOSIS — D0511 Intraductal carcinoma in situ of right breast: Secondary | ICD-10-CM | POA: Diagnosis present

## 2019-04-11 LAB — COMPREHENSIVE METABOLIC PANEL
ALT: 30 U/L (ref 0–44)
AST: 22 U/L (ref 15–41)
Albumin: 4.1 g/dL (ref 3.5–5.0)
Alkaline Phosphatase: 76 U/L (ref 38–126)
Anion gap: 7 (ref 5–15)
BUN: 23 mg/dL (ref 8–23)
CO2: 29 mmol/L (ref 22–32)
Calcium: 9.4 mg/dL (ref 8.9–10.3)
Chloride: 104 mmol/L (ref 98–111)
Creatinine, Ser: 0.89 mg/dL (ref 0.44–1.00)
GFR calc Af Amer: >60 mL/min (ref >60)
GFR calc non Af Amer: >60 mL/min (ref >60)
Glucose, Bld: 94 mg/dL (ref 70–99)
Potassium: 4 mmol/L (ref 3.5–5.1)
Sodium: 140 mmol/L (ref 135–145)
Total Bilirubin: 1.2 mg/dL (ref 0.3–1.2)
Total Protein: 6.9 g/dL (ref 6.5–8.1)

## 2019-04-12 ENCOUNTER — Encounter: Payer: Self-pay | Admitting: Oncology

## 2019-04-12 ENCOUNTER — Other Ambulatory Visit: Payer: Medicare Other

## 2019-04-12 ENCOUNTER — Inpatient Hospital Stay (HOSPITAL_BASED_OUTPATIENT_CLINIC_OR_DEPARTMENT_OTHER): Payer: Medicare PPO | Admitting: Oncology

## 2019-04-12 ENCOUNTER — Ambulatory Visit: Payer: Medicare Other | Admitting: Oncology

## 2019-04-12 ENCOUNTER — Inpatient Hospital Stay: Payer: Medicare PPO

## 2019-04-12 DIAGNOSIS — Z5181 Encounter for therapeutic drug level monitoring: Secondary | ICD-10-CM

## 2019-04-12 DIAGNOSIS — D0511 Intraductal carcinoma in situ of right breast: Secondary | ICD-10-CM | POA: Diagnosis not present

## 2019-04-12 DIAGNOSIS — Z79811 Long term (current) use of aromatase inhibitors: Secondary | ICD-10-CM

## 2019-04-12 MED ORDER — EXEMESTANE 25 MG PO TABS: 25.0000 mg | ORAL_TABLET | Freq: Every day | ORAL | 3 refills | Status: DC

## 2019-04-12 NOTE — Progress Notes (Signed)
Patient stated that she has had hot flashes, not able to sleep, losing her hair, low back pain, joint pain and right heel pain. Patient wanted to know if there is another medication that she could try to take.

## 2019-04-14 NOTE — Progress Notes (Signed)
I connected with Lauren Riley on 04/14/19 at 11:30 AM EST by video enabled telemedicine visit and verified that I am speaking with the correct person using two identifiers.   I discussed the limitations, risks, security and privacy concerns of performing an evaluation and management service by telemedicine and the availability of in-person appointments. I also discussed with the patient that there may be a patient responsible charge related to this service. The patient expressed understanding and agreed to proceed.  Other persons participating in the visit and their role in the encounter:  none  Patient's location:  home Provider's location:  Work  Diagnosis: intraductal papillary carcinoma right breast ER +  Chief Complaint: Routine follow-up of DCIS on Arimidex  History of present illness: patient is a 79 year old female who underwent a screening mammogram in September 2020 which showed a suspicious 9 mm mass in the right breast 9:30 position. 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 She was seen by Dr. Peyton Najjar and underwent Lumpectomy which showed focal residual noninvasive papillary neoplasm with low-grade features favor intraductal papillary carcinoma. No evidence of invasive carcinoma. 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  Patient's case was discussed at tumor board and radiation therapy adjuvantly was not deemed to be necessary.  She was started on Arimidex in November 2020.  Interval history: Patient reports increasing back pain as well as joint pain since she started taking Arimidex.  It is making it difficult for her to function.  She would like to try an alternative drug at this time.   Review of Systems  Constitutional: Positive for malaise/fatigue. Negative for chills, fever and weight loss.  HENT: Negative  for congestion, ear discharge and nosebleeds.   Eyes: Negative for blurred vision.  Respiratory: Negative for cough, hemoptysis, sputum production, shortness of breath and wheezing.   Cardiovascular: Negative for chest pain, palpitations, orthopnea and claudication.  Gastrointestinal: Negative for abdominal pain, blood in stool, constipation, diarrhea, heartburn, melena, nausea and vomiting.  Genitourinary: Negative for dysuria, flank pain, frequency, hematuria and urgency.  Musculoskeletal: Positive for back pain and joint pain. Negative for myalgias.  Skin: Negative for rash.  Neurological: Negative for dizziness, tingling, focal weakness, seizures, weakness and headaches.  Endo/Heme/Allergies: Does not bruise/bleed easily.  Psychiatric/Behavioral: Negative for depression and suicidal ideas. The patient does not have insomnia.     Allergies  Allergen Reactions  . Nsaids     Other reaction(s):  GI UPSET; CHEST BURNING FROM IBUPROFEN   . Fenofibrate     Liver Disorder Elevated LFT's   . Fish Oil Diarrhea    GI upset   . Niacin     Flushing   . Pravastatin Other (See Comments)    Mylagias   . Tape Rash    ADHESIVE TAPE- OK W/ PAPER TAPES. ADHESIVE TAPE- OK W/ PAPER TAPES    Past Medical History:  Diagnosis Date  . Anemia 2015   has had iron infusions  . Asthma   . Breast cancer ([place]) 01/2019   unsure if benign or malignant at this point in time  . Cancer ([place])    skin ca  . Chronic diarrhea   . Dyspnea    with exercise  . Dysrhythmia   . GERD (gastroesophageal reflux disease)   . Hypertension   . IBS (irritable bowel syndrome)   . Macular degeneration     Past Surgical History:  Procedure Laterality Date  . ABDOMINAL HYSTERECTOMY    . BREAST BIOPSY Left    unsure which side and between 1974 and 1980 Negative was a cyst  . BREAST BIOPSY Right 01/12/2019   intraductal papilloma  . BREAST LUMPECTOMY Right 01/26/2019`   intraductal papilloma excisied.   .  CHOLECYSTECTOMY    . [place]   diverticulitis that had ruptured  . COLOSTOMY CLOSURE  1995  . EYE SURGERY Bilateral    cataract extractions  . FRACTURE SURGERY     femur, ulna and radius. metal removed  . JOINT REPLACEMENT Bilateral 2013, 2015   bilateral knee  . PARTIAL MASTECTOMY WITH NEEDLE LOCALIZATION Right 01/26/2019   Procedure: PARTIAL MASTECTOMY WITH NEEDLE LOCALIZATION;  Surgeon: Herbert Pun, MD;  Location: [facility] ORS;  Service: General;  Laterality: Right;  . REPLACEMENT TOTAL KNEE Bilateral   . TONSILLECTOMY      Social History   Socioeconomic History  . Marital status: Married    Spouse name: Linna Hoff  . Number of children: Not on file  . Years of education: Not on file  . Highest education level: Not on file  Occupational History  . Occupation: Education officer, museum    Comment: retired  Tobacco Use  . Smoking status: Never Smoker  . Smokeless tobacco: Never Used  Substance and Sexual Activity  . Alcohol use: Never  . Drug use: Never  . Sexual activity: Not on file  Other Topics Concern  . Not on file  Social History Narrative   Recently transplanted to live near son and his family.   One daughter died in an MVA that patient was also in.   Social Determinants of Health   Financial Resource Strain:   . Difficulty of Paying Living Expenses: Not on file  Food Insecurity:   . Worried About Charity fundraiser in the Last Year: Not on file  . Ran Out of Food in the Last Year: Not on file  Transportation Needs:   . Lack of Transportation (Medical): Not on file  . Lack of Transportation (Non-Medical): Not on file  Physical Activity:   . Days of Exercise per Week: Not on file  . Minutes of Exercise per Session: Not on file  Stress:   . Feeling of Stress : Not on file  Social Connections:   . Frequency of Communication with Friends and Family: Not on file  . Frequency of Social Gatherings with Friends and Family: Not on file  . Attends Religious  Services: Not on file  . Active Member of Clubs or Organizations: Not on file  . Attends Archivist Meetings: Not on file  . Marital Status: Not on file  Intimate Partner Violence:   . Fear of Current or Ex-Partner: Not on file  . Emotionally Abused: Not on file  . Physically Abused: Not on file  . Sexually Abused: Not on file    Family History  Problem Relation Age of Onset  . Breast cancer Sister 40  . Breast cancer Maternal Aunt        mat aunt  . Parkinson's disease Mother   . Congestive Heart Failure Father   . Hypertension Father      Current Outpatient Medications:  .  Cholecalciferol (VITAMIN D3) 50 MCG (2000 UT) capsule, Take 2,000 Units by mouth daily. , Disp: , Rfl:  .  loperamide (IMODIUM A-D) 2 MG tablet, Take 2 mg by mouth 3 (three) times daily as needed for diarrhea or  loose stools. , Disp: , Rfl:  .  metoprolol succinate (TOPROL-XL) 100 MG 24 hr tablet, Take 100 mg by mouth daily. , Disp: , Rfl:  .  Multiple Vitamins-Minerals (PRESERVISION AREDS 2) CAPS, Take 1 capsule by mouth 2 (two) times daily. , Disp: , Rfl:  .  pantoprazole (PROTONIX) 40 MG tablet, Take 40 mg by mouth daily. , Disp: , Rfl:  .  exemestane (AROMASIN) 25 MG tablet, Take 1 tablet (25 mg total) by mouth daily after breakfast., Disp: 30 tablet, Rfl: 3 .  SUMAtriptan (IMITREX) 50 MG tablet, Take by mouth., Disp: , Rfl:   No results found.  No images are attached to the encounter.   CMP Latest Ref Rng & Units 04/11/2019  Glucose 70 - 99 mg/dL 94  BUN 8 - 23 mg/dL 23  Creatinine 0.44 - 1.00 mg/dL 0.89  Sodium 135 - 145 mmol/L 140  Potassium 3.5 - 5.1 mmol/L 4.0  Chloride 98 - 111 mmol/L 104  CO2 22 - 32 mmol/L 29  Calcium 8.9 - 10.3 mg/dL 9.4  Total Protein 6.5 - 8.1 g/dL 6.9  Total Bilirubin 0.3 - 1.2 mg/dL 1.2  Alkaline Phos 38 - 126 U/L 76  AST 15 - 41 U/L 22  ALT 0 - 44 U/L 30   No flowsheet data found.   Observation/objective: Appears in no acute distress on video visit  today.  Breathing is nonlabored  Assessment and plan: Patient is a 79 year old female with intraductal papillary carcinoma of the right breast ER positive s/p lumpectomy currently on Arimidex.  This is a routine follow-up visit.  Patient has had trouble tolerating her Arimidex with increasing joint pain as well as back pain.  She also reports increasing fatigue.  Have advised her to come off Arimidex and take a break from hormone therapy for about 3 weeks.  I will switch her to Aromasin at this time and see if she tolerates that better.  If patient continues to have problems with her Aromasin she will give [place] a call and we will consider switching her to tamoxifen at that time.  I have personally reviewed her labs including CMP today which is within normal limits  Follow-up instructions: I will see the patient back in 3 months for a video visit.  CBC with differential and CMP prior  I discussed the assessment and treatment plan with the patient. The patient was provided an opportunity to ask questions and all were answered. The patient agreed with the plan and demonstrated an understanding of the instructions.   The patient was advised to call back or seek an in-person evaluation if the symptoms worsen or if the condition fails to improve as anticipated.   Visit Diagnosis: 1. Ductal carcinoma in situ (DCIS) of right breast   2. Visit for monitoring Arimidex therapy     Dr. Randa Evens, MD, MPH [facility] Pager- [phone] 04/14/2019 12:01 PM

## 2019-07-11 ENCOUNTER — Other Ambulatory Visit: Payer: Self-pay

## 2019-07-11 ENCOUNTER — Inpatient Hospital Stay: Payer: Medicare PPO | Attending: Oncology

## 2019-07-11 DIAGNOSIS — D0511 Intraductal carcinoma in situ of right breast: Secondary | ICD-10-CM | POA: Diagnosis present

## 2019-07-11 LAB — COMPREHENSIVE METABOLIC PANEL
ALT: 15 U/L (ref 0–44)
AST: 18 U/L (ref 15–41)
Albumin: 4.2 g/dL (ref 3.5–5.0)
Alkaline Phosphatase: 68 U/L (ref 38–126)
Anion gap: 7 (ref 5–15)
BUN: 18 mg/dL (ref 8–23)
CO2: 29 mmol/L (ref 22–32)
Calcium: 9.7 mg/dL (ref 8.9–10.3)
Chloride: 105 mmol/L (ref 98–111)
Creatinine, Ser: 0.87 mg/dL (ref 0.44–1.00)
GFR calc Af Amer: >60 mL/min (ref >60)
GFR calc non Af Amer: >60 mL/min (ref >60)
Glucose, Bld: 98 mg/dL (ref 70–99)
Potassium: 4.6 mmol/L (ref 3.5–5.1)
Sodium: 141 mmol/L (ref 135–145)
Total Bilirubin: 1 mg/dL (ref 0.3–1.2)
Total Protein: 7 g/dL (ref 6.5–8.1)

## 2019-07-11 LAB — CBC WITH DIFFERENTIAL/PLATELET
Abs Immature Granulocytes: 0.02 10*3/uL (ref 0.00–0.07)
Basophils Absolute: 0 10*3/uL (ref 0.0–0.1)
Basophils Relative: 1 %
Eosinophils Absolute: 0.2 10*3/uL (ref 0.0–0.5)
Eosinophils Relative: 3 %
HCT: 40.5 % (ref 36.0–46.0)
Hemoglobin: 12.9 g/dL (ref 12.0–15.0)
Immature Granulocytes: 0 %
Lymphocytes Relative: 29 %
Lymphs Abs: 2.3 10*3/uL (ref 0.7–4.0)
MCH: 28.5 pg (ref 26.0–34.0)
MCHC: 31.9 g/dL (ref 30.0–36.0)
MCV: 89.4 fL (ref 80.0–100.0)
Monocytes Absolute: 0.6 10*3/uL (ref 0.1–1.0)
Monocytes Relative: 7 %
Neutro Abs: 4.7 10*3/uL (ref 1.7–7.7)
Neutrophils Relative %: 60 %
Platelets: 247 10*3/uL (ref 150–400)
RBC: 4.53 MIL/uL (ref 3.87–5.11)
RDW: 14.4 % (ref 11.5–15.5)
WBC: 7.9 10*3/uL (ref 4.0–10.5)
nRBC: 0 % (ref 0.0–0.2)

## 2019-07-12 ENCOUNTER — Inpatient Hospital Stay (HOSPITAL_BASED_OUTPATIENT_CLINIC_OR_DEPARTMENT_OTHER): Payer: Medicare PPO | Admitting: Oncology

## 2019-07-12 ENCOUNTER — Encounter: Payer: Self-pay | Admitting: Oncology

## 2019-07-12 DIAGNOSIS — D0511 Intraductal carcinoma in situ of right breast: Secondary | ICD-10-CM

## 2019-07-12 NOTE — Progress Notes (Signed)
Patient is doing video visit, she mentions trouble sleeping, still has joint pain and bloating with her Aromasin medication.

## 2019-07-14 NOTE — Progress Notes (Signed)
I connected with Lauren Riley on 07/14/19 at 11:30 AM EDT by video enabled telemedicine visit and verified that I am speaking with the correct person using two identifiers.   I discussed the limitations, risks, security and privacy concerns of performing an evaluation and management service by telemedicine and the availability of in-person appointments. I also discussed with the patient that there may be a patient responsible charge related to this service. The patient expressed understanding and agreed to proceed.  Other persons participating in the visit and their role in the encounter:  none  Patient's location:  home Provider's location:  work  Risk analyst Complaint: Routine follow-up of DCIS  History of present illness: patient is a 79 year old female who underwent a screening mammogram in September 2020 which showed a suspicious 9 mm mass in the right breast 9:30 position. 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 She was seen by Dr. Peyton Najjar and underwent Lumpectomy which showed focal residual noninvasive papillary neoplasm with low-grade features favor intraductal papillary carcinoma. No evidence of invasive carcinoma. 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 Patient's case was discussed at tumor board and radiation therapy adjuvantly was not deemed to be necessary.  She was started on Arimidex in November 2020 which she did not tolerate due to worsening joint pain and back pain and was switched to Aromasin   Interval history; patient reports some ongoing fatigue as well as mild joint pain with Aromasin as well but it is not as bad as Arimidex.  She continues to take her medications along with calcium and vitamin D.   Review of Systems  Constitutional: Positive for malaise/fatigue. Negative for chills, fever and weight loss.   HENT: Negative for congestion, ear discharge and nosebleeds.   Eyes: Negative for blurred vision.  Respiratory: Negative for cough, hemoptysis, sputum production, shortness of breath and wheezing.   Cardiovascular: Negative for chest pain, palpitations, orthopnea and claudication.  Gastrointestinal: Negative for abdominal pain, blood in stool, constipation, diarrhea, heartburn, melena, nausea and vomiting.  Genitourinary: Negative for dysuria, flank pain, frequency, hematuria and urgency.  Musculoskeletal: Positive for joint pain. Negative for back pain and myalgias.  Skin: Negative for rash.  Neurological: Negative for dizziness, tingling, focal weakness, seizures, weakness and headaches.  Endo/Heme/Allergies: Does not bruise/bleed easily.  Psychiatric/Behavioral: Negative for depression and suicidal ideas. The patient does not have insomnia.     Allergies  Allergen Reactions  . Nsaids     Other reaction(s):  GI UPSET; CHEST BURNING FROM IBUPROFEN   . Fenofibrate     Liver Disorder Elevated LFT's   . Fish Oil Diarrhea    GI upset   . Niacin     Flushing   . Pravastatin Other (See Comments)    Mylagias   . Tape Rash    ADHESIVE TAPE- OK W/ PAPER TAPES. ADHESIVE TAPE- OK W/ PAPER TAPES    Past Medical History:  Diagnosis Date  . Anemia 2015   has had iron infusions  . Asthma   . Breast cancer ([place]) 01/2019   unsure if benign or malignant at this point in time  . Cancer ([place])    skin ca  . Chronic diarrhea   . Dyspnea    with exercise  . Dysrhythmia   . GERD (gastroesophageal reflux disease)   . Hypertension   . IBS (irritable bowel syndrome)   . Macular degeneration  Past Surgical History:  Procedure Laterality Date  . ABDOMINAL HYSTERECTOMY    . BREAST BIOPSY Left    unsure which side and between 1974 and 1980 Negative was a cyst  . BREAST BIOPSY Right 01/12/2019   intraductal papilloma  . BREAST LUMPECTOMY Right 01/26/2019`   intraductal papilloma  excisied.   . CHOLECYSTECTOMY    . [place]   diverticulitis that had ruptured  . COLOSTOMY CLOSURE  1995  . EYE SURGERY Bilateral    cataract extractions  . FRACTURE SURGERY     femur, ulna and radius. metal removed  . JOINT REPLACEMENT Bilateral 2013, 2015   bilateral knee  . PARTIAL MASTECTOMY WITH NEEDLE LOCALIZATION Right 01/26/2019   Procedure: PARTIAL MASTECTOMY WITH NEEDLE LOCALIZATION;  Surgeon: Herbert Pun, MD;  Location: [facility] ORS;  Service: General;  Laterality: Right;  . REPLACEMENT TOTAL KNEE Bilateral   . TONSILLECTOMY      Social History   Socioeconomic History  . Marital status: Married    Spouse name: Linna Hoff  . Number of children: Not on file  . Years of education: Not on file  . Highest education level: Not on file  Occupational History  . Occupation: Education officer, museum    Comment: retired  Tobacco Use  . Smoking status: Never Smoker  . Smokeless tobacco: Never Used  Substance and Sexual Activity  . Alcohol use: Never  . Drug use: Never  . Sexual activity: Not on file  Other Topics Concern  . Not on file  Social History Narrative   Recently transplanted to live near son and his family.   One daughter died in an MVA that patient was also in.   Social Determinants of Health   Financial Resource Strain:   . Difficulty of Paying Living Expenses:   Food Insecurity:   . Worried About Charity fundraiser in the Last Year:   . Arboriculturist in the Last Year:   Transportation Needs:   . Film/video editor (Medical):   Marland Kitchen Lack of Transportation (Non-Medical):   Physical Activity:   . Days of Exercise per Week:   . Minutes of Exercise per Session:   Stress:   . Feeling of Stress :   Social Connections:   . Frequency of Communication with Friends and Family:   . Frequency of Social Gatherings with Friends and Family:   . Attends Religious Services:   . Active Member of Clubs or Organizations:   . Attends Archivist  Meetings:   Marland Kitchen Marital Status:   Intimate Partner Violence:   . Fear of Current or Ex-Partner:   . Emotionally Abused:   Marland Kitchen Physically Abused:   . Sexually Abused:     Family History  Problem Relation Age of Onset  . Breast cancer Sister 14  . Breast cancer Maternal Aunt        mat aunt  . Parkinson's disease Mother   . Congestive Heart Failure Father   . Hypertension Father      Current Outpatient Medications:  .  Acetaminophen (TYLENOL 8 HOUR ARTHRITIS PAIN PO), Take 2 tablets by mouth in the morning and at bedtime., Disp: , Rfl:  .  calcium carbonate (CALCIUM 600) 600 MG TABS tablet, Take 600 mg by mouth 2 (two) times daily with a meal., Disp: , Rfl:  .  Cholecalciferol (VITAMIN D3) 50 MCG (2000 UT) capsule, Take 2,000 Units by mouth daily. , Disp: , Rfl:  .  exemestane (AROMASIN)  25 MG tablet, Take 1 tablet (25 mg total) by mouth daily after breakfast., Disp: 30 tablet, Rfl: 3 .  loperamide (IMODIUM A-D) 2 MG tablet, Take 2 mg by mouth 3 (three) times daily as needed for diarrhea or loose stools. , Disp: , Rfl:  .  metoprolol succinate (TOPROL-XL) 100 MG 24 hr tablet, Take 100 mg by mouth daily. , Disp: , Rfl:  .  Multiple Vitamins-Minerals (PRESERVISION AREDS 2) CAPS, Take 1 capsule by mouth 2 (two) times daily. , Disp: , Rfl:  .  pantoprazole (PROTONIX) 40 MG tablet, Take 40 mg by mouth daily. , Disp: , Rfl:  .  SUMAtriptan (IMITREX) 50 MG tablet, Take by mouth., Disp: , Rfl:   No results found.  No images are attached to the encounter.   CMP Latest Ref Rng & Units 07/11/2019  Glucose 70 - 99 mg/dL 98  BUN 8 - 23 mg/dL 18  Creatinine 0.44 - 1.00 mg/dL 0.87  Sodium 135 - 145 mmol/L 141  Potassium 3.5 - 5.1 mmol/L 4.6  Chloride 98 - 111 mmol/L 105  CO2 22 - 32 mmol/L 29  Calcium 8.9 - 10.3 mg/dL 9.7  Total Protein 6.5 - 8.1 g/dL 7.0  Total Bilirubin 0.3 - 1.2 mg/dL 1.0  Alkaline Phos 38 - 126 U/L 68  AST 15 - 41 U/L 18  ALT 0 - 44 U/L 15   CBC Latest Ref Rng & Units  07/11/2019  WBC 4.0 - 10.5 K/uL 7.9  Hemoglobin 12.0 - 15.0 g/dL 12.9  Hematocrit 36.0 - 46.0 % 40.5  Platelets 150 - 400 K/uL 247     Observation/objective: Appears in no acute distress over video visit today.  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  This is a routine follow-up visit.  Patient seems to be tolerating Aromasin better than Arimidex.  She still has some joint pain but not as bad as that was on Arimidex.  She is willing to continue this medicine along with calcium and vitamin D.Her bone density scan in November 2020 showed osteopenia which we will continue to monitor.  Follow-up instructions: I will see her back in 6 months time with a CBC with differential and CMP  I discussed the assessment and treatment plan with the patient. The patient was provided an opportunity to ask questions and all were answered. The patient agreed with the plan and demonstrated an understanding of the instructions.   The patient was advised to call back or seek an in-person evaluation if the symptoms worsen or if the condition fails to improve as anticipated.    Visit Diagnosis: 1. Ductal carcinoma in situ (DCIS) of right breast     Dr. Randa Evens, MD, MPH [facility] at [facility] Tel- [phone] 07/14/2019 12:06 PM

## 2019-07-27 ENCOUNTER — Telehealth: Payer: Self-pay

## 2019-07-27 DIAGNOSIS — D0511 Intraductal carcinoma in situ of right breast: Secondary | ICD-10-CM

## 2019-07-27 NOTE — Telephone Encounter (Signed)
Survivorship Care Plan visit completed.  Treatment summary reviewed and mailed to patient.  ASCO answers booklet reviewed and mailed to patient.  CARE program and Cancer Transitions discussed with patient along with other resources cancer center offers to patients and caregivers.  Patient verbalized understanding.  SCP packet mailed.  Patient in agreement for APP to have a Virtual visit to introduce them to the Survivorship Clinic.  Encouraged patient to call for any questions or concerns. 

## 2019-07-31 ENCOUNTER — Other Ambulatory Visit: Payer: Self-pay | Admitting: Oncology

## 2019-08-10 ENCOUNTER — Inpatient Hospital Stay: Payer: Medicare PPO | Attending: Oncology | Admitting: Oncology

## 2019-08-10 DIAGNOSIS — D0511 Intraductal carcinoma in situ of right breast: Secondary | ICD-10-CM | POA: Diagnosis not present

## 2019-08-10 NOTE — Progress Notes (Signed)
Survivorship Clinic Consult Note [facility]  Telephone:([phone] [phone]  CLINIC:  Survivorship  REASON FOR VISIT:  Long-term survivorship surveillance visit for patient with history of DCIS.  BRIEF ONCOLOGIC HISTORY:  Oncology History Overview Note  patient is a 79 year old female who underwent a screening mammogram in September 2020 which showed a suspicious 9 mm mass in the right breast 9:30 position.  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  She was seen by Dr. 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  No evidence of invasive carcinoma.  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   Patient's case was discussed at tumor board and radiation therapy adjuvantly was not deemed to be necessary.  She was started on Arimidex in November 2020 which she did not tolerate due to worsening joint pain and back pain and was switched to Aromasin   Ductal carcinoma in situ (DCIS) of right breast  02/04/2019 Initial Diagnosis   Ductal carcinoma in situ (DCIS) of right breast    INTERVAL HISTORY:  Lauren Riley Kitchen is a 79 year old female who is followed by Dr. Janese Banks for DCIS.  She was last seen on 07/14/2019 for routine follow-up and appeared to be tolerating Aromasin better than Arimidex.  Previously, she had noted some joint pain on Arimidex and asked to switch medications.  She is scheduled to return to clinic in approximately 6 months for lab work and assessment.  ADDITIONAL REVIEW OF SYSTEMS:  Review of Systems  Constitutional: Negative.  Negative for chills, fever, malaise/fatigue and weight loss.  HENT: Negative for congestion, ear pain and tinnitus.   Eyes: Negative.  Negative for  blurred vision and double vision.  Respiratory: Negative.  Negative for cough, sputum production and shortness of breath.   Cardiovascular: Negative.  Negative for chest pain, palpitations and leg swelling.  Gastrointestinal: Negative.  Negative for abdominal pain, constipation, diarrhea, nausea and vomiting.  Genitourinary: Negative for dysuria, frequency and urgency.  Musculoskeletal: Negative for back pain and falls.  Skin: Negative.  Negative for rash.  Neurological: Negative.  Negative for weakness and headaches.  Endo/Heme/Allergies: Negative.  Does not bruise/bleed easily.  Psychiatric/Behavioral: Negative.  Negative for depression. The patient is not nervous/anxious and does not have insomnia.     PAST MEDICAL & SURGICAL HISTORY:  Past Medical History:  Diagnosis Date  . Anemia 2015   has had iron infusions  . Asthma   . Breast cancer ([place]) 01/2019   unsure if benign or malignant at this point in time  . Cancer ([place])    skin ca  . Chronic diarrhea   . Dyspnea    with exercise  . Dysrhythmia   . GERD (gastroesophageal reflux disease)   . Hypertension   . IBS (irritable bowel syndrome)   . Macular degeneration    Past Surgical History:  Procedure Laterality Date  . ABDOMINAL HYSTERECTOMY    . BREAST BIOPSY Left    unsure which side and between 1974 and 1980 Negative was a cyst  . BREAST BIOPSY Right 01/12/2019   intraductal papilloma  . BREAST LUMPECTOMY Right 01/26/2019`   intraductal papilloma excisied.   . CHOLECYSTECTOMY    . [place]   diverticulitis that had ruptured  . COLOSTOMY CLOSURE  1995  . EYE SURGERY Bilateral  cataract extractions  . FRACTURE SURGERY     femur, ulna and radius. metal removed  . JOINT REPLACEMENT Bilateral 2013, 2015   bilateral knee  . PARTIAL MASTECTOMY WITH NEEDLE LOCALIZATION Right 01/26/2019   Procedure: PARTIAL MASTECTOMY WITH NEEDLE LOCALIZATION;  Surgeon: Herbert Pun, MD;  Location: [facility] ORS;   Service: General;  Laterality: Right;  . REPLACEMENT TOTAL KNEE Bilateral   . TONSILLECTOMY      SOCIAL HISTORY:  None   CURRENT MEDICATIONS:  Current Outpatient Medications on File Prior to Visit  Medication Sig Dispense Refill  . Acetaminophen (TYLENOL 8 HOUR ARTHRITIS PAIN PO) Take 2 tablets by mouth in the morning and at bedtime.    Riley Kitchen anastrozole (ARIMIDEX) 1 MG tablet TAKE 1 TABLET(1 MG) BY MOUTH DAILY 90 tablet 0  . calcium carbonate (CALCIUM 600) 600 MG TABS tablet Take 600 mg by mouth 2 (two) times daily with a meal.    . Cholecalciferol (VITAMIN D3) 50 MCG (2000 UT) capsule Take 2,000 Units by mouth daily.     Riley Kitchen exemestane (AROMASIN) 25 MG tablet Take 1 tablet (25 mg total) by mouth daily after breakfast. 30 tablet 3  . loperamide (IMODIUM A-D) 2 MG tablet Take 2 mg by mouth 3 (three) times daily as needed for diarrhea or loose stools.     . metoprolol succinate (TOPROL-XL) 100 MG 24 hr tablet Take 100 mg by mouth daily.     . Multiple Vitamins-Minerals (PRESERVISION AREDS 2) CAPS Take 1 capsule by mouth 2 (two) times daily.     . pantoprazole (PROTONIX) 40 MG tablet Take 40 mg by mouth daily.     . SUMAtriptan (IMITREX) 50 MG tablet Take by mouth.     No current facility-administered medications on file prior to visit.    ALLERGIES:  Allergies  Allergen Reactions  . Nsaids     Other reaction(s):  GI UPSET; CHEST BURNING FROM IBUPROFEN   . Fenofibrate     Liver Disorder Elevated LFT's   . Fish Oil Diarrhea    GI upset   . Niacin     Flushing   . Pravastatin Other (See Comments)    Mylagias   . Tape Rash    ADHESIVE TAPE- OK W/ PAPER TAPES. ADHESIVE TAPE- OK W/ PAPER TAPES    PHYSICAL EXAM:  Limited due to virtual platform  LABORATORY DATA:  Lab Results  Component Value Date   WBC 7.9 07/11/2019   HGB 12.9 07/11/2019   HCT 40.5 07/11/2019   MCV 89.4 07/11/2019   PLT 247 07/11/2019      Chemistry      Component Value Date/Time   NA 141 07/11/2019  1135   K 4.6 07/11/2019 1135   CL 105 07/11/2019 1135   CO2 29 07/11/2019 1135   BUN 18 07/11/2019 1135   CREATININE 0.87 07/11/2019 1135      Component Value Date/Time   CALCIUM 9.7 07/11/2019 1135   ALKPHOS 68 07/11/2019 1135   AST 18 07/11/2019 1135   ALT 15 07/11/2019 1135   BILITOT 1.0 07/11/2019 1135      DIAGNOSTIC IMAGING:  Bone Density  T score -2.1  Mammogram 12/31/2018  IMPRESSION: Indeterminate 9 mm mass over the 9:30 position of the right breast 10 cm from the nipple.  RECOMMENDATION: Recommend ultrasound-guided core needle biopsy of this indeterminate mass.  ASSESSMENT & PLAN:  Lauren Riley is a pleasant 79 y.o. female with history of DCIS treated with lumpectomy and currently on Aromasin.  Previously on Arimidex which was discontinued secondary to joint pain. Patient presents to survivorship clinic today for survivorship care plan visit and to address any acute survivorship concerns since completing treatment.    1. History of DCIS right breast: Clinically, she is without evidence of disease recurrence based on physical exam.  Today, she received a copy of her survivorship care plan (SCP) document, which was reviewed with her in detail.  The SCP details her cancer treatment history and potential late/long-term side effects of those treatments.  We discussed the follow-up schedule she can anticipate with interval imaging for surveillance of her cancer.  I have also shared a copy of her treatment summary/SCP with her PCP.  Lauren Riley will return to the survivorship clinic as needed; she will return to [place] at [facility] for surveillance visit with Dr. Janese Banks in October 2021.   2. Problem at visit: None  3. Smoking cessation: I commended Lauren Riley's continued efforts to remain tobacco-free.  We discussed that one of the most important risk reduction strategies in preventing cancer recurrence in lung cancer patients is smoking cessation.  He is  committed to abstaining from tobacco.  4. Physical activity/Healthy eating: Getting adequate physical activity and maintaining a healthy diet as a cancer survivor is important for overall wellness and reduces the risk of cancer recurrence. We discussed the CARE program which is a fitness program that is offered to cancer survivors free of charge.  We also reviewed the American Cancer Society's booklet with recommendations for nutrition and physical activity.    4. Health promotion/Cancer screening:  Lauren Riley is reportedly up-to-date on her colonoscopy, pap smear, PSA tests, skin screenings, and vaccinations.  I encouraged her to talk with his PCP about arranging appropriate cancer screening tests, as appropriate.   5. Support services/Counseling: Lauren Riley was seen today in in effort to address both the physical and social concerns of our cancer survivors at [facility] at [facility]. It is not uncommon for this period of the patient's cancer care trajectory to be one of many emotions and stressors.  I provided support today through active listening, validation of concerns, and expressive supportive counseling.  Lauren Riley was encouraged to take advantage of our support services programs and support groups to better cope in her new life as a cancer survivor after completing anti-cancer treatment.   NCCN Guidelines and Recommendations:  NCCN guidelines recommends the following surveillance for DCIS of breast post-operatively (1.2018):  1. Interval history and physical exam every 6-12 months for 5 years, then annually.  2. Mammogram every 12 months (first mammogram 6-12 months after breast conserving therapy, category 2B).  3. If treated with Tamoxifen, monitor per NCCN guidelines for Breast Cancer Risk Reduction.  Dispo:  -RTC for follow-up with oncologist   A total of 30 minutes was spent in face-to-face care of this patient, with greater than 50% of that time spent in  counseling and care coordination.    Rulon Abide, AGNP-C [place] at [facility] [phone] (office) 08/10/19 10:46 AM

## 2019-08-24 ENCOUNTER — Other Ambulatory Visit: Payer: Self-pay | Admitting: Oncology

## 2019-09-13 NOTE — Addendum Note (Signed)
Addended by: Faythe Casa E on: 09/13/2019 01:32 PM   Modules accepted: Level of Service

## 2019-12-16 ENCOUNTER — Other Ambulatory Visit: Payer: Self-pay | Admitting: General Surgery

## 2019-12-16 DIAGNOSIS — D0511 Intraductal carcinoma in situ of right breast: Secondary | ICD-10-CM

## 2019-12-22 ENCOUNTER — Other Ambulatory Visit: Payer: Self-pay | Admitting: Oncology

## 2020-01-04 ENCOUNTER — Other Ambulatory Visit: Payer: Medicare PPO

## 2020-01-05 ENCOUNTER — Ambulatory Visit
Admission: RE | Admit: 2020-01-05 | Discharge: 2020-01-05 | Disposition: A | Discharge status: Home / Self Care | Payer: Medicare PPO | Source: Ambulatory Visit | Attending: General Surgery | Admitting: General Surgery

## 2020-01-05 ENCOUNTER — Other Ambulatory Visit: Payer: Self-pay

## 2020-01-05 ENCOUNTER — Ambulatory Visit: Payer: Medicare PPO

## 2020-01-05 DIAGNOSIS — D0511 Intraductal carcinoma in situ of right breast: Secondary | ICD-10-CM | POA: Diagnosis present

## 2020-01-11 ENCOUNTER — Encounter: Payer: Self-pay | Admitting: Oncology

## 2020-01-11 NOTE — Progress Notes (Signed)
Pt states that she is still having pain in joints. Her hands are the worse. Some days are better than others. She does have arthritis but the AI just makes it worse. Because of her bowel surgery and gallbladder being removed she has constant loose stools and was told that it usually does not get better. She tries to do her breast exam but sometimes not sure if she is doing it right. I said I will reach out for teaching and mail it to her.

## 2020-01-12 ENCOUNTER — Other Ambulatory Visit: Payer: Self-pay

## 2020-01-12 ENCOUNTER — Inpatient Hospital Stay: Payer: Medicare PPO | Attending: Oncology

## 2020-01-12 ENCOUNTER — Inpatient Hospital Stay: Payer: Medicare PPO | Admitting: Oncology

## 2020-01-12 ENCOUNTER — Encounter: Payer: Self-pay | Admitting: Oncology

## 2020-01-12 VITALS — BP 159/74 | HR 87 | Temp 97.8°F | Resp 20 | Wt 202.6 lb

## 2020-01-12 DIAGNOSIS — E119 Type 2 diabetes mellitus without complications: Secondary | ICD-10-CM | POA: Insufficient documentation

## 2020-01-12 DIAGNOSIS — Z8249 Family history of ischemic heart disease and other diseases of the circulatory system: Secondary | ICD-10-CM | POA: Insufficient documentation

## 2020-01-12 DIAGNOSIS — Z170 Estrogen receptor positive status [ER+]: Secondary | ICD-10-CM | POA: Diagnosis not present

## 2020-01-12 DIAGNOSIS — Z803 Family history of malignant neoplasm of breast: Secondary | ICD-10-CM | POA: Insufficient documentation

## 2020-01-12 DIAGNOSIS — Z79811 Long term (current) use of aromatase inhibitors: Secondary | ICD-10-CM | POA: Diagnosis not present

## 2020-01-12 DIAGNOSIS — K219 Gastro-esophageal reflux disease without esophagitis: Secondary | ICD-10-CM | POA: Diagnosis not present

## 2020-01-12 DIAGNOSIS — Z79899 Other long term (current) drug therapy: Secondary | ICD-10-CM | POA: Insufficient documentation

## 2020-01-12 DIAGNOSIS — D0511 Intraductal carcinoma in situ of right breast: Secondary | ICD-10-CM

## 2020-01-12 DIAGNOSIS — K589 Irritable bowel syndrome without diarrhea: Secondary | ICD-10-CM | POA: Insufficient documentation

## 2020-01-12 LAB — CBC WITH DIFFERENTIAL/PLATELET
Abs Immature Granulocytes: 0.03 10*3/uL (ref 0.00–0.07)
Basophils Absolute: 0 10*3/uL (ref 0.0–0.1)
Basophils Relative: 0 %
Eosinophils Absolute: 0.2 10*3/uL (ref 0.0–0.5)
Eosinophils Relative: 3 %
HCT: 39 % (ref 36.0–46.0)
Hemoglobin: 12.7 g/dL (ref 12.0–15.0)
Immature Granulocytes: 0 %
Lymphocytes Relative: 29 %
Lymphs Abs: 2.3 10*3/uL (ref 0.7–4.0)
MCH: 29.3 pg (ref 26.0–34.0)
MCHC: 32.6 g/dL (ref 30.0–36.0)
MCV: 89.9 fL (ref 80.0–100.0)
Monocytes Absolute: 0.6 10*3/uL (ref 0.1–1.0)
Monocytes Relative: 7 %
Neutro Abs: 4.9 10*3/uL (ref 1.7–7.7)
Neutrophils Relative %: 61 %
Platelets: 230 10*3/uL (ref 150–400)
RBC: 4.34 MIL/uL (ref 3.87–5.11)
RDW: 13.8 % (ref 11.5–15.5)
WBC: 8.1 10*3/uL (ref 4.0–10.5)
nRBC: 0 % (ref 0.0–0.2)

## 2020-01-12 LAB — COMPREHENSIVE METABOLIC PANEL
ALT: 18 U/L (ref 0–44)
AST: 21 U/L (ref 15–41)
Albumin: 3.9 g/dL (ref 3.5–5.0)
Alkaline Phosphatase: 60 U/L (ref 38–126)
Anion gap: 8 (ref 5–15)
BUN: 16 mg/dL (ref 8–23)
CO2: 27 mmol/L (ref 22–32)
Calcium: 9.1 mg/dL (ref 8.9–10.3)
Chloride: 105 mmol/L (ref 98–111)
Creatinine, Ser: 0.92 mg/dL (ref 0.44–1.00)
GFR calc non Af Amer: 60 mL/min — ABNORMAL LOW (ref >60)
Glucose, Bld: 99 mg/dL (ref 70–99)
Potassium: 4.3 mmol/L (ref 3.5–5.1)
Sodium: 140 mmol/L (ref 135–145)
Total Bilirubin: 1.2 mg/dL (ref 0.3–1.2)
Total Protein: 6.5 g/dL (ref 6.5–8.1)

## 2020-01-14 NOTE — Progress Notes (Signed)
Hematology/Oncology Consult note [facility]  [phone] [phone]  Patient Care Team: Barbaraann Boys, MD as PCP - General (Pediatrics) Sindy Guadeloupe, MD as Consulting Physician (Oncology) Herbert Pun, MD as Consulting Physician (General Surgery) Rico Junker, RN as Registered Nurse   Name of the patient: Lauren Riley  [id number]  Aug 05, 1940   Date of visit: 01/14/20  Diagnosis- right breast dcis ER positive  Chief complaint/ Reason for visit- routine f/u of right breast DCIS  Heme/Onc history: patient is a 79 year old female who underwent a screening mammogram in September 2020 which showed a suspicious 9 mm mass in the right breast 9:30 position. 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 She was seen by Dr. Peyton Najjar and underwent Lumpectomy which showed focal residual noninvasive papillary neoplasm with low-grade features favor intraductal papillary carcinoma. No evidence of invasive carcinoma. 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 Patient's case was discussed at tumor board and radiation therapy adjuvantly was not deemed to be necessary. She was started on Arimidex in November 2020 which she did not tolerate due to worsening joint pain and back pain and was switched to Aromasin   Interval history- Patient is concerned about her weight gain. She has chronic joint pain but feels that it has been worse since she started taking AI  ECOG PS- 1 Pain scale- 3   Review of systems- Review of Systems  Constitutional: Positive for malaise/fatigue. Negative for chills, fever and weight loss.  HENT: Negative for congestion, ear discharge and nosebleeds.   Eyes: Negative for blurred vision.  Respiratory: Negative for cough, hemoptysis, sputum production,  shortness of breath and wheezing.   Cardiovascular: Negative for chest pain, palpitations, orthopnea and claudication.  Gastrointestinal: Negative for abdominal pain, blood in stool, constipation, diarrhea, heartburn, melena, nausea and vomiting.  Genitourinary: Negative for dysuria, flank pain, frequency, hematuria and urgency.  Musculoskeletal: Positive for joint pain. Negative for back pain and myalgias.  Skin: Negative for rash.  Neurological: Negative for dizziness, tingling, focal weakness, seizures, weakness and headaches.  Endo/Heme/Allergies: Does not bruise/bleed easily.  Psychiatric/Behavioral: Negative for depression and suicidal ideas. The patient does not have insomnia.       Allergies  Allergen Reactions  . Nsaids     Other reaction(s):  GI UPSET; CHEST BURNING FROM IBUPROFEN   . Fenofibrate     Liver Disorder Elevated LFT's   . Fish Oil Diarrhea    GI upset   . Niacin     Flushing   . Pravastatin Other (See Comments)    Mylagias   . Tape Rash    ADHESIVE TAPE- OK W/ PAPER TAPES. ADHESIVE TAPE- OK W/ PAPER TAPES     Past Medical History:  Diagnosis Date  . Anemia 2015   has had iron infusions  . Asthma   . Breast cancer ([place]) 01/2019   unsure if benign or malignant at this point in time  . Cancer ([place])    skin ca  . Chronic diarrhea   . Dyspnea    with exercise  . Dysrhythmia   . GERD (gastroesophageal reflux disease)   . Hypertension   . IBS (irritable bowel syndrome)   . Macular degeneration      Past Surgical History:  Procedure Laterality Date  . ABDOMINAL HYSTERECTOMY    . BREAST BIOPSY Left    unsure which  side and between 1974 and 1980 Negative was a cyst  . BREAST BIOPSY Right 01/12/2019   intraductal papilloma  . BREAST LUMPECTOMY Right 01/26/2019`   intraductal papilloma excisied.   . CHOLECYSTECTOMY    . [place]   diverticulitis that had ruptured  . COLOSTOMY CLOSURE  1995  . EYE SURGERY Bilateral    cataract  extractions  . FRACTURE SURGERY     femur, ulna and radius. metal removed  . JOINT REPLACEMENT Bilateral 2013, 2015   bilateral knee  . PARTIAL MASTECTOMY WITH NEEDLE LOCALIZATION Right 01/26/2019   Procedure: PARTIAL MASTECTOMY WITH NEEDLE LOCALIZATION;  Surgeon: Herbert Pun, MD;  Location: [facility] ORS;  Service: General;  Laterality: Right;  . REPLACEMENT TOTAL KNEE Bilateral   . TONSILLECTOMY      Social History   Socioeconomic History  . Marital status: Married    Spouse name: Linna Hoff  . Number of children: Not on file  . Years of education: Not on file  . Highest education level: Not on file  Occupational History  . Occupation: Education officer, museum    Comment: retired  Tobacco Use  . Smoking status: Never Smoker  . Smokeless tobacco: Never Used  Vaping Use  . Vaping Use: Never used  Substance and Sexual Activity  . Alcohol use: Never  . Drug use: Never  . Sexual activity: Not on file  Other Topics Concern  . Not on file  Social History Narrative   Recently transplanted to live near son and his family.   One daughter died in an MVA that patient was also in.   Social Determinants of Health   Financial Resource Strain:   . Difficulty of Paying Living Expenses: Not on file  Food Insecurity:   . Worried About Charity fundraiser in the Last Year: Not on file  . Ran Out of Food in the Last Year: Not on file  Transportation Needs:   . Lack of Transportation (Medical): Not on file  . Lack of Transportation (Non-Medical): Not on file  Physical Activity:   . Days of Exercise per Week: Not on file  . Minutes of Exercise per Session: Not on file  Stress:   . Feeling of Stress : Not on file  Social Connections:   . Frequency of Communication with Friends and Family: Not on file  . Frequency of Social Gatherings with Friends and Family: Not on file  . Attends Religious Services: Not on file  . Active Member of Clubs or Organizations: Not on file  . Attends Theatre manager Meetings: Not on file  . Marital Status: Not on file  Intimate Partner Violence:   . Fear of Current or Ex-Partner: Not on file  . Emotionally Abused: Not on file  . Physically Abused: Not on file  . Sexually Abused: Not on file    Family History  Problem Relation Age of Onset  . Breast cancer Sister 59  . Breast cancer Maternal Aunt        mat aunt  . Parkinson's disease Mother   . Congestive Heart Failure Father   . Hypertension Father      Current Outpatient Medications:  .  Acetaminophen (TYLENOL 8 HOUR ARTHRITIS PAIN PO), Take 2 tablets by mouth in the morning and at bedtime., Disp: , Rfl:  .  calcium carbonate (CALCIUM 600) 600 MG TABS tablet, Take 600 mg by mouth daily with breakfast. , Disp: , Rfl:  .  Cholecalciferol (VITAMIN D3) 25  MCG (1000 UT) CAPS, Take 1 capsule by mouth daily., Disp: , Rfl:  .  exemestane (AROMASIN) 25 MG tablet, TAKE 1 TABLET(25 MG) BY MOUTH DAILY AFTER BREAKFAST, Disp: 30 tablet, Rfl: 3 .  loperamide (IMODIUM A-D) 2 MG tablet, Take 2 mg by mouth 3 (three) times daily as needed for diarrhea or loose stools. , Disp: , Rfl:  .  Multiple Vitamins-Minerals (PRESERVISION AREDS 2) CAPS, Take 1 capsule by mouth 2 (two) times daily. , Disp: , Rfl:  .  pantoprazole (PROTONIX) 40 MG tablet, Take 40 mg by mouth daily. , Disp: , Rfl:  .  SUMAtriptan (IMITREX) 50 MG tablet, Take by mouth., Disp: , Rfl:  .  metoprolol succinate (TOPROL-XL) 100 MG 24 hr tablet, Take 100 mg by mouth daily. , Disp: , Rfl:   Physical exam:  Vitals:   01/12/20 1137  BP: (!) 159/74  Pulse: 87  Resp: 20  Temp: 97.8 F (36.6 C)  SpO2: 97%  Weight: 202 lb 9.6 oz (91.9 kg)   Physical Exam Cardiovascular:     Rate and Rhythm: Normal rate and regular rhythm.     Heart sounds: Normal heart sounds.  Pulmonary:     Effort: Pulmonary effort is normal.     Breath sounds: Normal breath sounds.  Abdominal:     General: Bowel sounds are normal.     Palpations: Abdomen is  soft.  Skin:    General: Skin is warm and dry.  Neurological:     Mental Status: She is alert and oriented to person, place, and time.      CMP Latest Ref Rng & Units 01/12/2020  Glucose 70 - 99 mg/dL 99  BUN 8 - 23 mg/dL 16  Creatinine 0.44 - 1.00 mg/dL 0.92  Sodium 135 - 145 mmol/L 140  Potassium 3.5 - 5.1 mmol/L 4.3  Chloride 98 - 111 mmol/L 105  CO2 22 - 32 mmol/L 27  Calcium 8.9 - 10.3 mg/dL 9.1  Total Protein 6.5 - 8.1 g/dL 6.5  Total Bilirubin 0.3 - 1.2 mg/dL 1.2  Alkaline Phos 38 - 126 U/L 60  AST 15 - 41 U/L 21  ALT 0 - 44 U/L 18   CBC Latest Ref Rng & Units 01/12/2020  WBC 4.0 - 10.5 K/uL 8.1  Hemoglobin 12.0 - 15.0 g/dL 12.7  Hematocrit 36 - 46 % 39.0  Platelets 150 - 400 K/uL 230    No images are attached to the encounter.  MM DIAG BREAST TOMO BILATERAL  Result Date: 01/05/2020 CLINICAL DATA:  Status post RIGHT lump in October 2020. Patient takes anastrozole. EXAM: DIGITAL DIAGNOSTIC BILATERAL MAMMOGRAM WITH TOMO AND CAD COMPARISON:  01/26/2019 and earlier ACR Breast Density Category b: There are scattered areas of fibroglandular density. FINDINGS: Post operative changes are seen in the RIGHTbreast. No suspicious mass, distortion, or microcalcifications are identified to suggest presence of malignancy. Mammographic images were processed with CAD. IMPRESSION: No mammographic evidence for malignancy. RECOMMENDATION: Diagnostic mammogram is suggested in 1 year. (Code:DM-B-01Y) I have discussed the findings and recommendations with the patient. If applicable, a reminder letter will be sent to the patient regarding the next appointment. BI-RADS CATEGORY  2: Benign. Electronically Signed   By: Nolon Nations M.D.   On: 01/05/2020 15:55     Assessment and plan- Patient is a 79 y.o. 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    Is a routine follow-up visit  Clinically patient is  doing well with no  concerning signs and symptoms of recurrence based on today's exam.  Recent mammogram from 01/05/2020 did not reveal any evidence of malignancy.  She also had a breast exam by Dr. Peyton Najjar today.  I will therefore defer breast exam at this time  Patient does report significant fatigue as well as joint pain and weight gain as well after starting Aromasin.  Discussed that weight gain was similar to placebo in studies.  However joint pain was certainly a side effect possibly secondary to Aromasin.  Have asked her to hold off on taking the medication for the next 3 to 4 weeks and see how her symptoms are doing.  If there is significant improvement in her symptoms perhaps we can consider switching her to tamoxifen and if side effects continue we may have to stop it keeping in mind her quality of life.  I will see her back in 3 months   Visit Diagnosis 1. Ductal carcinoma in situ (DCIS) of right breast      Dr. Randa Evens, MD, MPH [facility] at [facility] [phone] 01/14/2020 7:39 AM

## 2020-02-03 ENCOUNTER — Other Ambulatory Visit: Payer: Self-pay | Admitting: *Deleted

## 2020-02-03 ENCOUNTER — Encounter: Payer: Self-pay | Admitting: Oncology

## 2020-02-03 MED ORDER — EXEMESTANE 25 MG PO TABS: 25.0000 mg | ORAL_TABLET | Freq: Every day | ORAL | 3 refills | Status: DC

## 2020-04-24 ENCOUNTER — Inpatient Hospital Stay: Payer: Medicare PPO | Admitting: Oncology

## 2020-05-08 ENCOUNTER — Inpatient Hospital Stay: Payer: Medicare PPO | Attending: Oncology | Admitting: Oncology

## 2020-05-08 ENCOUNTER — Encounter: Payer: Self-pay | Admitting: Oncology

## 2020-05-08 VITALS — BP 168/78 | HR 64 | Temp 98.1°F | Resp 20 | Wt 201.9 lb

## 2020-05-08 DIAGNOSIS — Z170 Estrogen receptor positive status [ER+]: Secondary | ICD-10-CM | POA: Diagnosis not present

## 2020-05-08 DIAGNOSIS — D0511 Intraductal carcinoma in situ of right breast: Secondary | ICD-10-CM | POA: Diagnosis present

## 2020-05-08 DIAGNOSIS — Z79811 Long term (current) use of aromatase inhibitors: Secondary | ICD-10-CM

## 2020-05-08 NOTE — Progress Notes (Signed)
Hematology/Oncology Consult note [facility]  [phone] Fax:([phone]  Patient Care Team: Barbaraann Boys, MD as PCP - General (Pediatrics) Sindy Guadeloupe, MD as Consulting Physician (Oncology) Herbert Pun, MD as Consulting Physician (General Surgery) Rico Junker, RN as Registered Nurse   Name of the patient: Lauren Riley  [id number]  06-26-1940   Date of visit: 05/08/20  Diagnosis- right breast dcis ER positive  Chief complaint/ Reason for visit- routine f/u of right breast dcis  Heme/Onc history: patient is a 80 year old female who underwent a screening mammogram in September 2020 which showed a suspicious 9 mm mass in the right breast 9:30 position. 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 She was seen by Dr. Peyton Najjar and underwent Lumpectomy which showed focal residual noninvasive papillary neoplasm with low-grade features favor intraductal papillary carcinoma. No evidence of invasive carcinoma. 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 Patient's case was discussed at tumor board and radiation therapy adjuvantly was not deemed to be necessary. She was started on Arimidex in November 2020which she did not tolerate due to worsening joint pain and back pain and was switched to Aromasin   Interval history-patient reports occasional joint pain especially in her fingers which waxes and wanes but she has been able to continue her exemestane at this time.  Denies other complaints  ECOG PS- 1 Pain scale- 0   Review of systems- Review of Systems  Constitutional: Negative for chills, fever, malaise/fatigue and weight loss.  HENT: Negative for congestion, ear discharge and nosebleeds.   Eyes: Negative for blurred vision.  Respiratory: Negative for cough,  hemoptysis, sputum production, shortness of breath and wheezing.   Cardiovascular: Negative for chest pain, palpitations, orthopnea and claudication.  Gastrointestinal: Negative for abdominal pain, blood in stool, constipation, diarrhea, heartburn, melena, nausea and vomiting.  Genitourinary: Negative for dysuria, flank pain, frequency, hematuria and urgency.  Musculoskeletal: Positive for joint pain (In the interphalangeal joint of the left hand). Negative for back pain and myalgias.  Skin: Negative for rash.  Neurological: Negative for dizziness, tingling, focal weakness, seizures, weakness and headaches.  Endo/Heme/Allergies: Does not bruise/bleed easily.  Psychiatric/Behavioral: Negative for depression and suicidal ideas. The patient does not have insomnia.       Allergies  Allergen Reactions  . Nsaids     Other reaction(s):  GI UPSET; CHEST BURNING FROM IBUPROFEN   . Fenofibrate     Liver Disorder Elevated LFT's   . Fish Oil Diarrhea    GI upset   . Niacin     Flushing   . Pravastatin Other (See Comments)    Mylagias   . Tape Rash    ADHESIVE TAPE- OK W/ PAPER TAPES. ADHESIVE TAPE- OK W/ PAPER TAPES     Past Medical History:  Diagnosis Date  . Anemia 2015   has had iron infusions  . Asthma   . Breast cancer ([place]) 01/2019   unsure if benign or malignant at this point in time  . Cancer ([place])    skin ca  . Chronic diarrhea   . Dyspnea    with exercise  . Dysrhythmia   . GERD (gastroesophageal reflux disease)   . Hypertension   . IBS (irritable bowel syndrome)   . Macular degeneration      Past Surgical History:  Procedure Laterality Date  . ABDOMINAL HYSTERECTOMY    .  BREAST BIOPSY Left    unsure which side and between 1974 and 1980 Negative was a cyst  . BREAST BIOPSY Right 01/12/2019   intraductal papilloma  . BREAST LUMPECTOMY Right 01/26/2019`   intraductal papilloma excisied.   . CHOLECYSTECTOMY    . COLON SURGERY  1995   diverticulitis that had  ruptured  . COLOSTOMY CLOSURE  1995  . EYE SURGERY Bilateral    cataract extractions  . FRACTURE SURGERY     femur, ulna and radius. metal removed  . JOINT REPLACEMENT Bilateral 2013, 2015   bilateral knee  . PARTIAL MASTECTOMY WITH NEEDLE LOCALIZATION Right 01/26/2019   Procedure: PARTIAL MASTECTOMY WITH NEEDLE LOCALIZATION;  Surgeon: Carolan Shiver, MD;  Location: [facility] ORS;  Service: General;  Laterality: Right;  . REPLACEMENT TOTAL KNEE Bilateral   . TONSILLECTOMY      Social History   Socioeconomic History  . Marital status: Married    Spouse name: Jesusita Oka  . Number of children: Not on file  . Years of education: Not on file  . Highest education level: Not on file  Occupational History  . Occupation: Engineer, site    Comment: retired  Tobacco Use  . Smoking status: Never Smoker  . Smokeless tobacco: Never Used  Vaping Use  . Vaping Use: Never used  Substance and Sexual Activity  . Alcohol use: Never  . Drug use: Never  . Sexual activity: Not on file  Other Topics Concern  . Not on file  Social History Narrative   Recently transplanted to live near son and his family.   One daughter died in an MVA that patient was also in.   Social Determinants of Health   Financial Resource Strain: Not on file  Food Insecurity: Not on file  Transportation Needs: Not on file  Physical Activity: Not on file  Stress: Not on file  Social Connections: Not on file  Intimate Partner Violence: Not on file    Family History  Problem Relation Age of Onset  . Breast cancer Sister 18  . Breast cancer Maternal Aunt        mat aunt  . Parkinson's disease Mother   . Congestive Heart Failure Father   . Hypertension Father      Current Outpatient Medications:  .  Acetaminophen (TYLENOL 8 HOUR ARTHRITIS PAIN PO), Take 2 tablets by mouth in the morning and at bedtime., Disp: , Rfl:  .  calcium carbonate (CALCIUM 600) 600 MG TABS tablet, Take 600 mg by mouth daily with breakfast. ,  Disp: , Rfl:  .  Cholecalciferol (VITAMIN D3) 25 MCG (1000 UT) CAPS, Take 1 capsule by mouth daily., Disp: , Rfl:  .  exemestane (AROMASIN) 25 MG tablet, Take 1 tablet (25 mg total) by mouth daily after breakfast., Disp: 90 tablet, Rfl: 3 .  loperamide (IMODIUM A-D) 2 MG tablet, Take 2 mg by mouth 3 (three) times daily as needed for diarrhea or loose stools. , Disp: , Rfl:  .  metoprolol succinate (TOPROL-XL) 100 MG 24 hr tablet, Take 100 mg by mouth daily. , Disp: , Rfl:  .  Multiple Vitamins-Minerals (PRESERVISION AREDS 2) CAPS, Take 1 capsule by mouth 2 (two) times daily. , Disp: , Rfl:  .  pantoprazole (PROTONIX) 40 MG tablet, Take 40 mg by mouth daily. , Disp: , Rfl:  .  SUMAtriptan (IMITREX) 50 MG tablet, Take by mouth., Disp: , Rfl:   Physical exam:  Vitals:   05/08/20 1308  BP: (!) 168/78  Pulse: 64  Resp: 20  Temp: 98.1 F (36.7 C)  TempSrc: Tympanic  SpO2: 100%  Weight: 201 lb 14.4 oz (91.6 kg)   Physical Exam Eyes:     Extraocular Movements: EOM normal.  Cardiovascular:     Rate and Rhythm: Normal rate and regular rhythm.     Heart sounds: Normal heart sounds.  Pulmonary:     Effort: Pulmonary effort is normal.     Breath sounds: Normal breath sounds.  Abdominal:     General: Bowel sounds are normal.     Palpations: Abdomen is soft.  Musculoskeletal:        General: No swelling or deformity.  Skin:    General: Skin is warm and dry.  Neurological:     Mental Status: She is alert and oriented to person, place, and time.    Breast exam was performed in seated and lying down position. Patient is status post right lumpectomy with a well-healed surgical scar. No evidence of any palpable masses. No evidence of axillary adenopathy. No evidence of any palpable masses or lumps in the left breast. No evidence of leftt axillary adenopathy   CMP Latest Ref Rng & Units 01/12/2020  Glucose 70 - 99 mg/dL 99  BUN 8 - 23 mg/dL 16  Creatinine 0.44 - 1.00 mg/dL 0.92  Sodium 135  - 145 mmol/L 140  Potassium 3.5 - 5.1 mmol/L 4.3  Chloride 98 - 111 mmol/L 105  CO2 22 - 32 mmol/L 27  Calcium 8.9 - 10.3 mg/dL 9.1  Total Protein 6.5 - 8.1 g/dL 6.5  Total Bilirubin 0.3 - 1.2 mg/dL 1.2  Alkaline Phos 38 - 126 U/L 60  AST 15 - 41 U/L 21  ALT 0 - 44 U/L 18   CBC Latest Ref Rng & Units 01/12/2020  WBC 4.0 - 10.5 K/uL 8.1  Hemoglobin 12.0 - 15.0 g/dL 12.7  Hematocrit 36.0 - 46.0 % 39.0  Platelets 150 - 400 K/uL 230      Assessment and plan- Patient is a 80 y.o. 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    This is a routine follow-up visit  Patient has some self-limited joint pain especially in the interphalangeal joints but she has been able to function without any limitation and is continuing Aromasin.  She understands if her pain increases she can take a temporary break from AI. Mammogram in September 2021 showed no evidence of malignancy.  Clinically she is doing well with no concerning signs and symptoms of recurrence.  I will see her back in 6 months for a video visit.  She will need a bone density scan in November 2022.  Mammogram will be scheduled by Dr. Peyton Najjar. Visit Diagnosis 1. Ductal carcinoma in situ (DCIS) of right breast   2. Use of exemestane (Aromasin)      Dr. Randa Evens, MD, MPH [facility] at [facility] [phone] 05/08/2020 1:07 PM

## 2020-11-05 ENCOUNTER — Other Ambulatory Visit: Payer: Self-pay

## 2020-11-05 ENCOUNTER — Inpatient Hospital Stay: Payer: Medicare PPO | Attending: Oncology | Admitting: Oncology

## 2020-11-05 DIAGNOSIS — Z853 Personal history of malignant neoplasm of breast: Secondary | ICD-10-CM

## 2020-11-05 DIAGNOSIS — Z08 Encounter for follow-up examination after completed treatment for malignant neoplasm: Secondary | ICD-10-CM | POA: Diagnosis not present

## 2020-11-05 DIAGNOSIS — G894 Chronic pain syndrome: Secondary | ICD-10-CM

## 2020-11-05 DIAGNOSIS — Z79811 Long term (current) use of aromatase inhibitors: Secondary | ICD-10-CM | POA: Diagnosis not present

## 2020-11-05 NOTE — Addendum Note (Signed)
Addended by: Kern Alberta on: 11/05/2020 05:09 PM   Modules accepted: Orders

## 2020-11-05 NOTE — Progress Notes (Signed)
I connected with Orvan Falconer on 11/05/20 at  1:00 PM EDT by video enabled telemedicine visit and verified that I am speaking with the correct person using two identifiers.   I discussed the limitations, risks, security and privacy concerns of performing an evaluation and management service by telemedicine and the availability of in-person appointments. I also discussed with the patient that there may be a patient responsible charge related to this service. The patient expressed understanding and agreed to proceed.  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  She was seen by Dr. 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  No evidence of invasive carcinoma.  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   Patient's case was discussed at tumor board and radiation therapy adjuvantly was not deemed to be necessary.  She was started on Arimidex in November 2020 which she did not tolerate due to worsening joint pain and back pain and was switched to Aromasin    Interval history patient still reports ongoing diffuse joint pains.  She did stop exemestane for 2 weeks and joint pain improved somewhat but did not go away completely.   Review of Systems  Constitutional:  Negative for chills, fever, malaise/fatigue and weight loss.  HENT:  Negative for  congestion, ear discharge and nosebleeds.   Eyes:  Negative for blurred vision.  Respiratory:  Negative for cough, hemoptysis, sputum production, shortness of breath and wheezing.   Cardiovascular:  Negative for chest pain, palpitations, orthopnea and claudication.  Gastrointestinal:  Negative for abdominal pain, blood in stool, constipation, diarrhea, heartburn, melena, nausea and vomiting.  Genitourinary:  Negative for dysuria, flank pain, frequency, hematuria and urgency.  Musculoskeletal:  Positive for joint pain. Negative for back pain and myalgias.  Skin:  Negative for rash.  Neurological:  Negative for dizziness, tingling, focal weakness, seizures, weakness and headaches.  Endo/Heme/Allergies:  Does not bruise/bleed easily.  Psychiatric/Behavioral:  Negative for depression and suicidal ideas. The patient does not have insomnia.    Allergies  Allergen Reactions   Nsaids     Other reaction(s):  GI UPSET; CHEST BURNING FROM IBUPROFEN    Fenofibrate     Liver Disorder Elevated LFT's    Fish Oil Diarrhea    GI upset    Niacin     Flushing    Pravastatin Other (See Comments)    Mylagias    Tape Rash    ADHESIVE TAPE- OK W/ PAPER TAPES. ADHESIVE TAPE- OK W/ PAPER TAPES    Past Medical History:  Diagnosis Date   Anemia 2015   has had iron infusions   Asthma    Breast cancer ([place]) 01/2019   unsure if benign or malignant at this point in time   Cancer [facility])    skin ca   Chronic diarrhea    Dyspnea    with exercise   Dysrhythmia    GERD (gastroesophageal reflux disease)    Hypertension  IBS (irritable bowel syndrome)    Macular degeneration     Past Surgical History:  Procedure Laterality Date   ABDOMINAL HYSTERECTOMY     BREAST BIOPSY Left    unsure which side and between 1974 and 1980 Negative was a cyst   BREAST BIOPSY Right 01/12/2019   intraductal papilloma   BREAST LUMPECTOMY Right 01/26/2019`   intraductal papilloma excisied.    [place]   diverticulitis that had ruptured   COLOSTOMY CLOSURE  1995   EYE SURGERY Bilateral    cataract extractions   FRACTURE SURGERY     femur, ulna and radius. metal removed   JOINT REPLACEMENT Bilateral 2013, 2015   bilateral knee   PARTIAL MASTECTOMY WITH NEEDLE LOCALIZATION Right 01/26/2019   Procedure: PARTIAL MASTECTOMY WITH NEEDLE LOCALIZATION;  Surgeon: Herbert Pun, MD;  Location: [facility] ORS;  Service: General;  Laterality: Right;   REPLACEMENT TOTAL KNEE Bilateral    TONSILLECTOMY      Social History   Socioeconomic History   Marital status: Married    Spouse name: Dan   Number of children: Not on file   Years of education: Not on file   Highest education level: Not on file  Occupational History   Occupation: school teacher    Comment: retired  Tobacco Use   Smoking status: Never   Smokeless tobacco: Never  Vaping Use   Vaping Use: Never used  Substance and Sexual Activity   Alcohol use: Never   Drug use: Never   Sexual activity: Not on file  Other Topics Concern   Not on file  Social History Narrative   Recently transplanted to live near son and his family.   One daughter died in an MVA that patient was also in.   Social Determinants of Health   Financial Resource Strain: Not on file  Food Insecurity: Not on file  Transportation Needs: Not on file  Physical Activity: Not on file  Stress: Not on file  Social Connections: Not on file  Intimate Partner Violence: Not on file    Family History  Problem Relation Age of Onset   Breast cancer Sister 70   Breast cancer Maternal Aunt        mat aunt   Parkinson's disease Mother    Congestive Heart Failure Father    Hypertension Father      Current Outpatient Medications:    Acetaminophen (TYLENOL 8 HOUR ARTHRITIS PAIN PO), Take 2 tablets by mouth in the morning and at bedtime., Disp: , Rfl:    calcium carbonate (OS-CAL) 600 MG TABS tablet, Take 600 mg by mouth daily with  breakfast. , Disp: , Rfl:    Cholecalciferol (VITAMIN D3) 25 MCG (1000 UT) CAPS, Take 1 capsule by mouth daily., Disp: , Rfl:    exemestane (AROMASIN) 25 MG tablet, Take 1 tablet (25 mg total) by mouth daily after breakfast., Disp: 90 tablet, Rfl: 3   loperamide (IMODIUM A-D) 2 MG tablet, Take 2 mg by mouth 3 (three) times daily as needed for diarrhea or loose stools. , Disp: , Rfl:    metoprolol succinate (TOPROL-XL) 100 MG 24 hr tablet, Take 100 mg by mouth daily. , Disp: , Rfl:    Multiple Vitamins-Minerals (PRESERVISION AREDS 2) CAPS, Take 1 capsule by mouth 2 (two) times daily. , Disp: , Rfl:    pantoprazole (PROTONIX) 40 MG tablet, Take 40 mg by mouth daily. , Disp: , Rfl:    SUMAtriptan (  IMITREX) 50 MG tablet, Take by mouth. (Patient not taking: Reported on 11/05/2020), Disp: , Rfl:   No results found.  No images are attached to the encounter.   CMP Latest Ref Rng & Units 01/12/2020  Glucose 70 - 99 mg/dL 99  BUN 8 - 23 mg/dL 16  Creatinine 0.44 - 1.00 mg/dL 0.92  Sodium 135 - 145 mmol/L 140  Potassium 3.5 - 5.1 mmol/L 4.3  Chloride 98 - 111 mmol/L 105  CO2 22 - 32 mmol/L 27  Calcium 8.9 - 10.3 mg/dL 9.1  Total Protein 6.5 - 8.1 g/dL 6.5  Total Bilirubin 0.3 - 1.2 mg/dL 1.2  Alkaline Phos 38 - 126 U/L 60  AST 15 - 41 U/L 21  ALT 0 - 44 U/L 18   CBC Latest Ref Rng & Units 01/12/2020  WBC 4.0 - 10.5 K/uL 8.1  Hemoglobin 12.0 - 15.0 g/dL 12.7  Hematocrit 36.0 - 46.0 % 39.0  Platelets 150 - 400 K/uL 230     Observation/objective: Appears in no acute distress over video visit today.  Breathing is nonlabored  Assessment and plan: Patient is a 80 year old female with right breast DCIS ER positive here for routine follow-up  Patient was switched from Arimidex to exemestane due to side effects and patient continues to have diffuse joint pains despite that.  I have asked her to hold off on taking exemestane for the next 4 weeks and see if her symptoms improve any further.  We will  also send her information about letrozole and tamoxifen that could be potential alternatives to exemestane at this time.  Patient states that her joint pains are affecting her quality of life.  I will refer her to rheumatology as well to see if there is any other etiology for her ongoing joint pains   Follow-up instructions: I will see her back in 6 months  I discussed the assessment and treatment plan with the patient. The patient was provided an opportunity to ask questions and all were answered. The patient agreed with the plan and demonstrated an understanding of the instructions.   The patient was advised to call back or seek an in-person evaluation if the symptoms worsen or if the condition fails to improve as anticipated.    Visit Diagnosis: 1. Use of exemestane (Aromasin)   2. Encounter for follow-up surveillance of breast cancer     Dr. Randa Evens, MD, MPH [facility] at [facility] Tel- [phone] 11/05/2020 4:50 PM

## 2020-11-05 NOTE — Progress Notes (Signed)
Pt with video visit

## 2020-11-14 ENCOUNTER — Telehealth: Payer: Self-pay

## 2020-11-14 NOTE — Telephone Encounter (Signed)
Pt was referred to [facility]  Rheum she has an appt with them 12/25/20 @ 1 pm-sjc

## 2020-11-19 ENCOUNTER — Other Ambulatory Visit: Payer: Self-pay | Admitting: General Surgery

## 2020-11-19 DIAGNOSIS — D0511 Intraductal carcinoma in situ of right breast: Secondary | ICD-10-CM

## 2020-12-13 ENCOUNTER — Other Ambulatory Visit: Payer: Self-pay | Admitting: Oncology

## 2021-01-07 ENCOUNTER — Other Ambulatory Visit: Payer: Medicare PPO

## 2021-01-16 ENCOUNTER — Ambulatory Visit
Admission: RE | Admit: 2021-01-16 | Discharge: 2021-01-16 | Disposition: A | Discharge status: Home / Self Care | Payer: Medicare PPO | Source: Ambulatory Visit | Attending: General Surgery | Admitting: General Surgery

## 2021-01-16 ENCOUNTER — Other Ambulatory Visit: Payer: Self-pay

## 2021-01-16 DIAGNOSIS — D0511 Intraductal carcinoma in situ of right breast: Secondary | ICD-10-CM

## 2021-01-24 IMAGING — MG US  BREAST BX W/ LOC DEV 1ST LESION IMG BX SPEC US GUIDE*R*
1 series · 8 of 8 positions shown · non-contrast
Comparison: Previous exam(s).
COMPARISON: Previous exam(s).

Addendum:
CLINICAL DATA: Patient presents for ultrasound-guided core needle
biopsy of the right breast, for a small mass in the 9:30 o'clock
position.

EXAM:
ULTRASOUND GUIDED RIGHT BREAST CORE NEEDLE BIOPSY

[Series 1: MG view · 0.06mm/px · 8 of 24 slices shown]
[im 1/24]
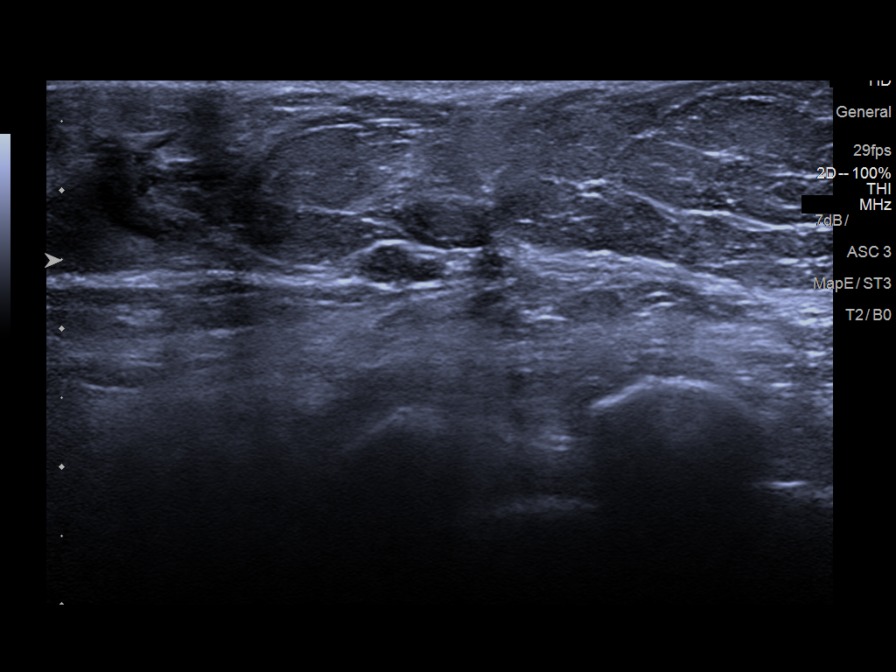
[im 4/24]
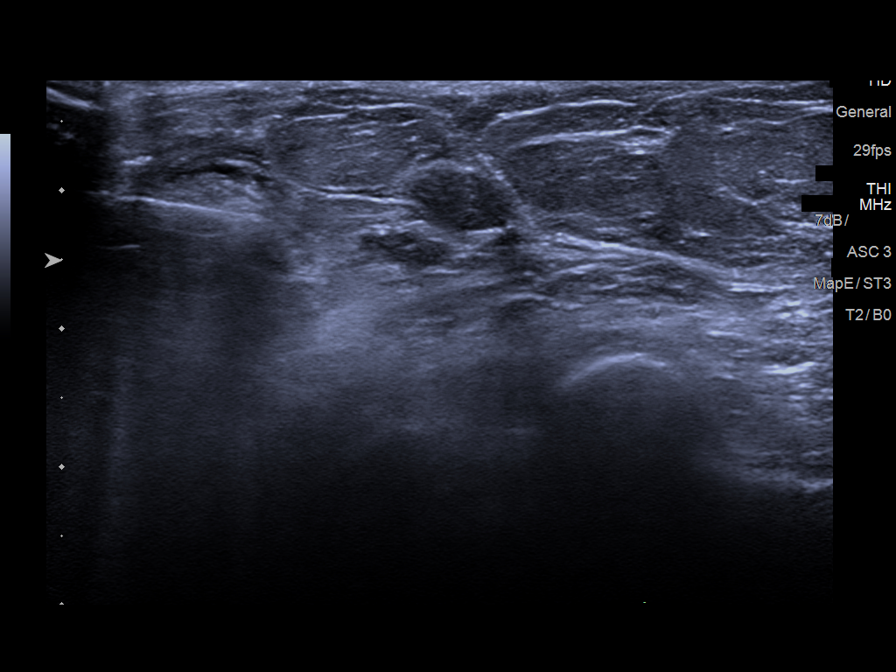
[im 7/24]
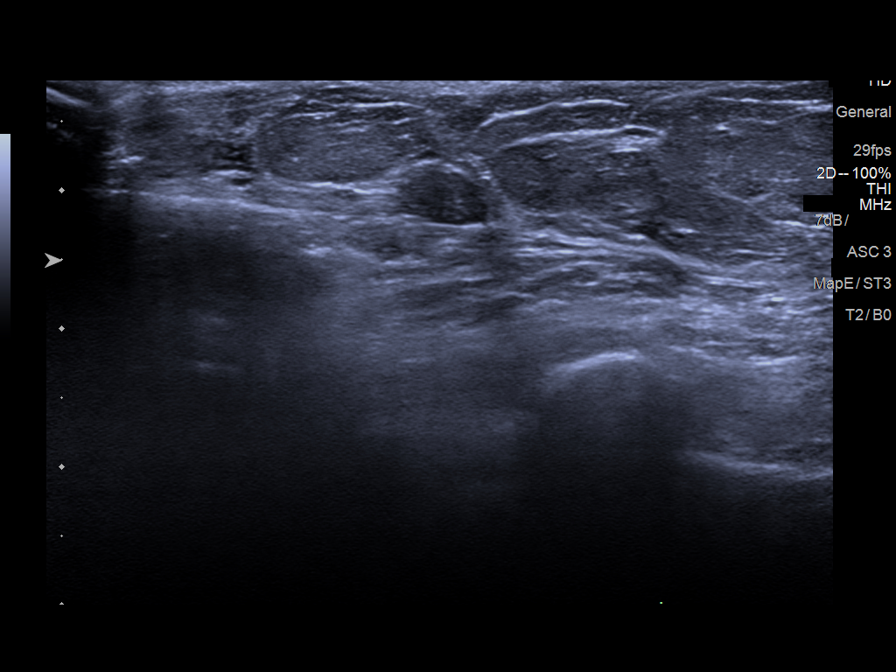
[im 10/24]
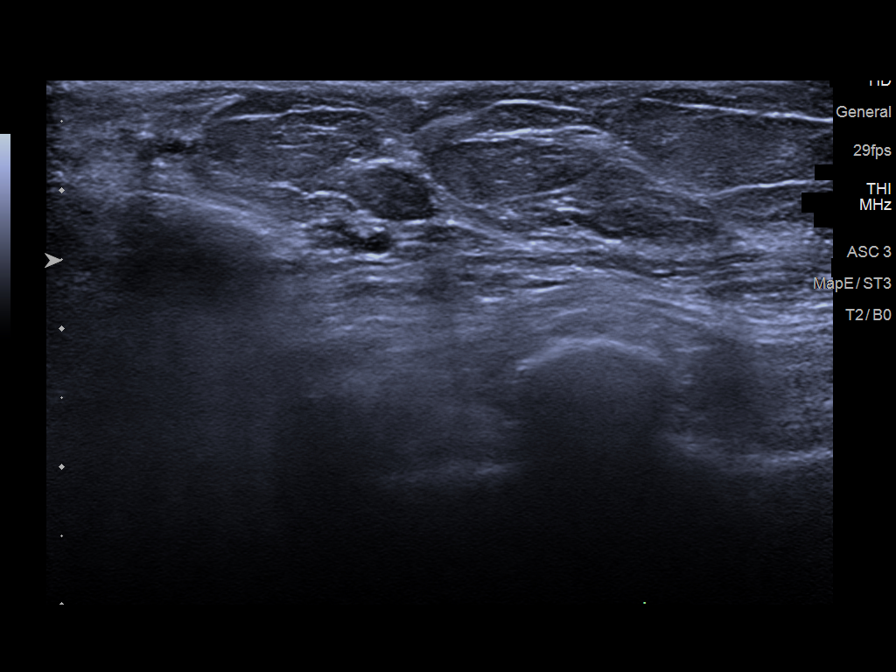
[im 14/24]
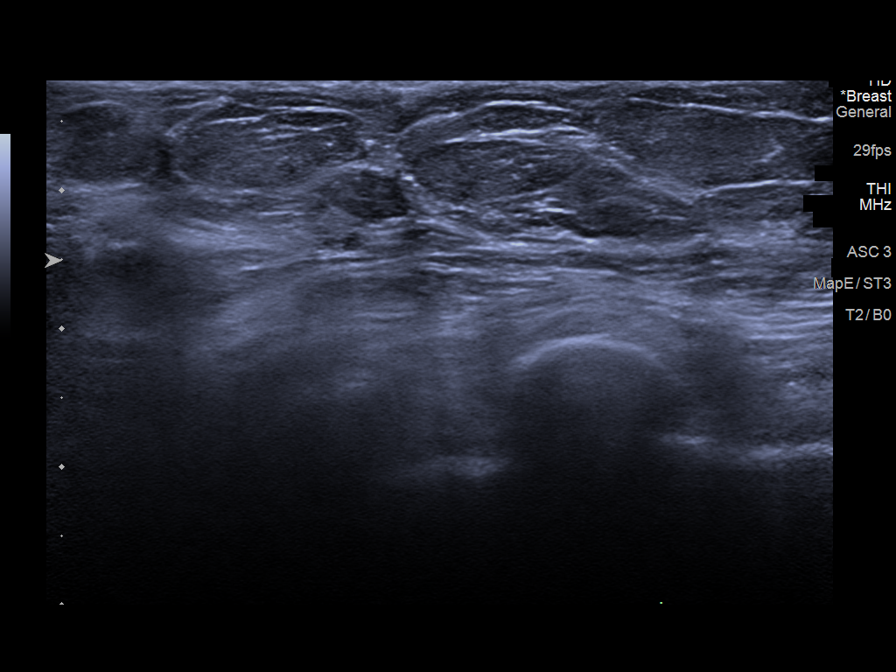
[im 17/24]
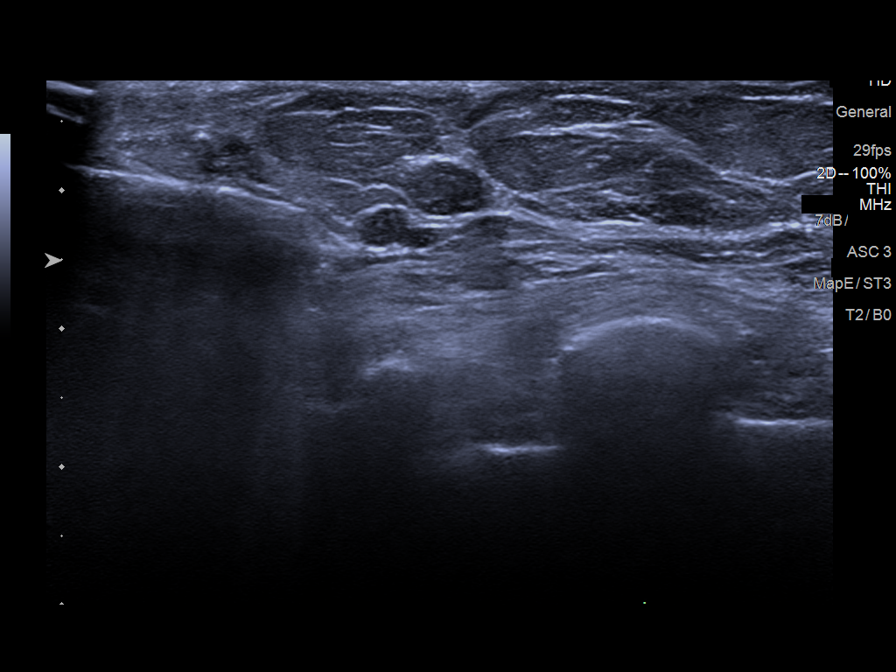
[im 20/24]
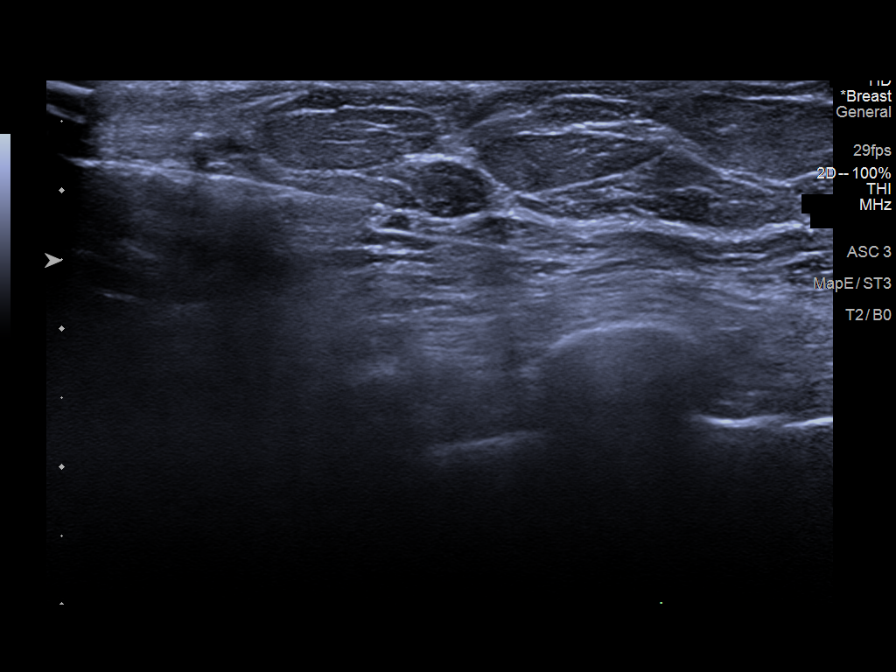
[im 24/24]
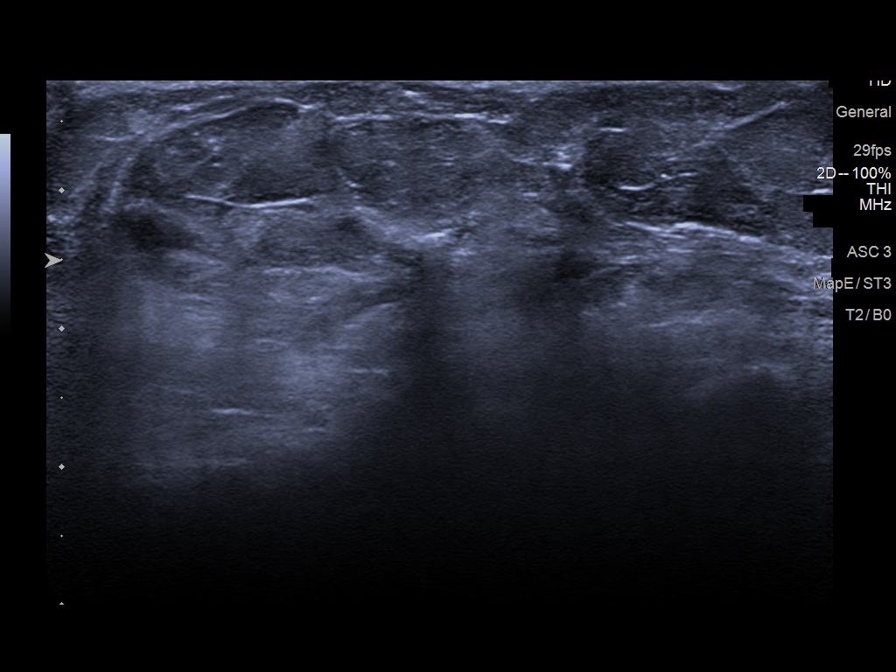

[8 of 8 positions shown; findings below may reference images not displayed]



Lesion quadrant: Upper outer quadrant

Using sterile technique and 1% Lidocaine as local anesthetic, under
direct ultrasound visualization, a 12 gauge Databex device was
used to perform biopsy of the small mass in the 9:30 o'clock
position of the right breast, 10 cm the nipple, using an inferior
approach. At the conclusion of the procedure coil shaped tissue
marker clip was deployed into the biopsy cavity. Follow up 2 view
mammogram was performed and dictated separately.
IMPRESSION: Ultrasound guided biopsy of the right breast. No apparent
complications.

ADDENDUM:
PATHOLOGY revealed: DIAGNOSIS: A. BREAST, RIGHT AT 930 O'CLOCK, 10
CM FROM THE NIPPLE; - CYSTIC SPACES CONTAINING PAPILLARY EPITHELIAL
PROLIFERATIONS WITH ABSENCE OF MYOEPITHELIAL CELLS, SEE COMMENT. -
NEGATIVE FOR INVASIVE CARCINOMA.

Comment: Sections display cystic spaces with a papillary epithelial
proliferation showing mild cytologic atypia and a paucity of stromal
elements. Immunohistochemical stains were performed. These stains
demonstrate an absence of myoepithelial markers calponin and p63.
The differential diagnosis includes intraductal papillary carcinoma
and encapsulated papillary carcinoma. These entities are considered
non-invasive, and there is no evidence of stromal invasion in the
tissue present for evaluation. Ancillary ER testing is pending, and
will be reported as an addendum.

Pathology results are CONCORDANT with imaging findings, per Dr.
Aion Gabo.

Pathology results were discussed with patient via telephone. The
patient reported doing well after the biopsy with no adverse
symptoms, only tenderness at the site. Post biopsy care instructions
were reviewed and questions were answered. The patient was
encouraged to call [HOSPITAL] for any additional
questions or concerns.

Recommendation: Surgical referral. Request for surgical referral was
relayed to nurse navigators at [HOSPITAL] [HOSPITAL] by
Kimani Orduno RN on 01/14/2019.

Addendum by Kimani Orduno RN on 01/17/2019.



Lesion quadrant: Upper outer quadrant

Using sterile technique and 1% Lidocaine as local anesthetic, under
direct ultrasound visualization, a 12 gauge Databex device was
used to perform biopsy of the small mass in the 9:30 o'clock
position of the right breast, 10 cm the nipple, using an inferior
approach. At the conclusion of the procedure coil shaped tissue
marker clip was deployed into the biopsy cavity. Follow up 2 view
mammogram was performed and dictated separately.
IMPRESSION: Ultrasound guided biopsy of the right breast. No apparent
complications.

## 2021-01-24 IMAGING — MG MM BREAST LOCALIZATION CLIP
4 series · 4 of 12 positions shown · non-contrast
Comparison: Previous exam(s).

CLINICAL DATA: Status post ultrasound-guided core needle biopsy of
a right breast mass.

EXAM:
DIAGNOSTIC RIGHT MAMMOGRAM POST ULTRASOUND BIOPSY

[R CC synth-2D]
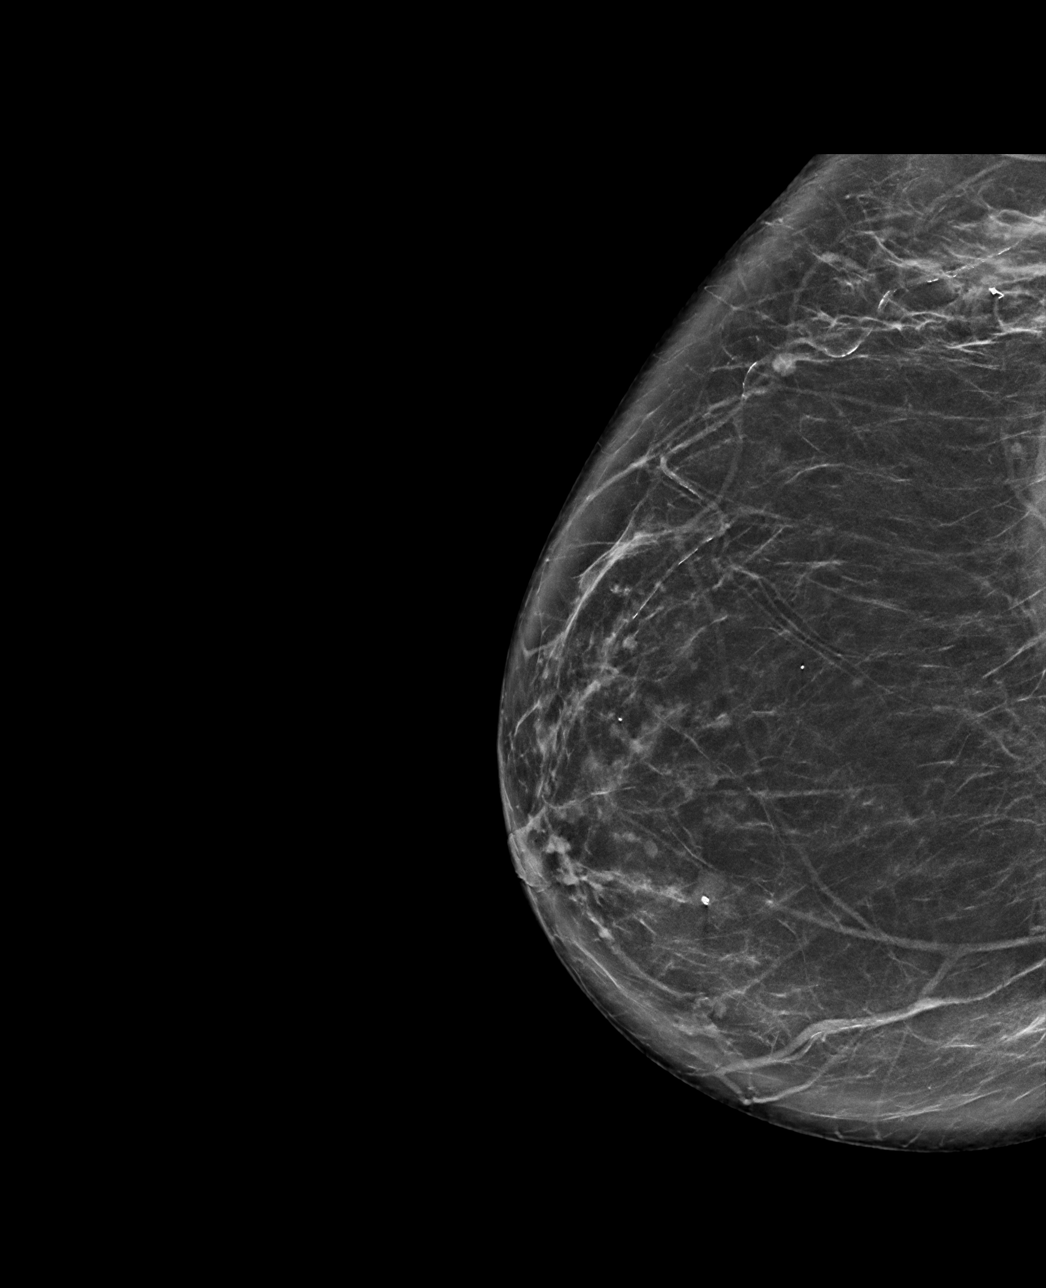

[R ML synth-2D]
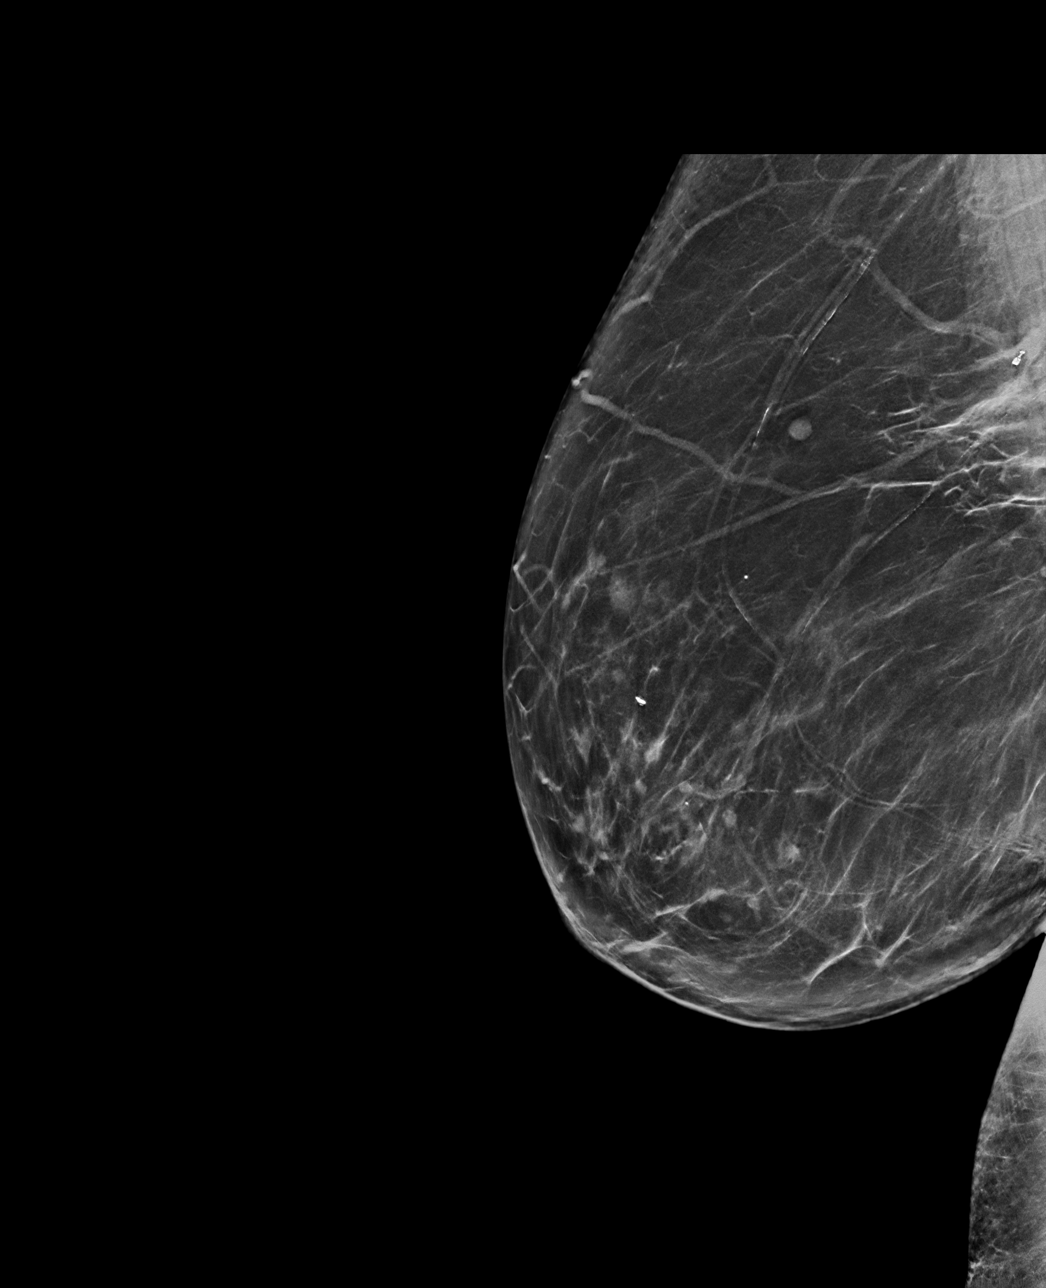

[R CC tomo · tomo slice 43/86.0]
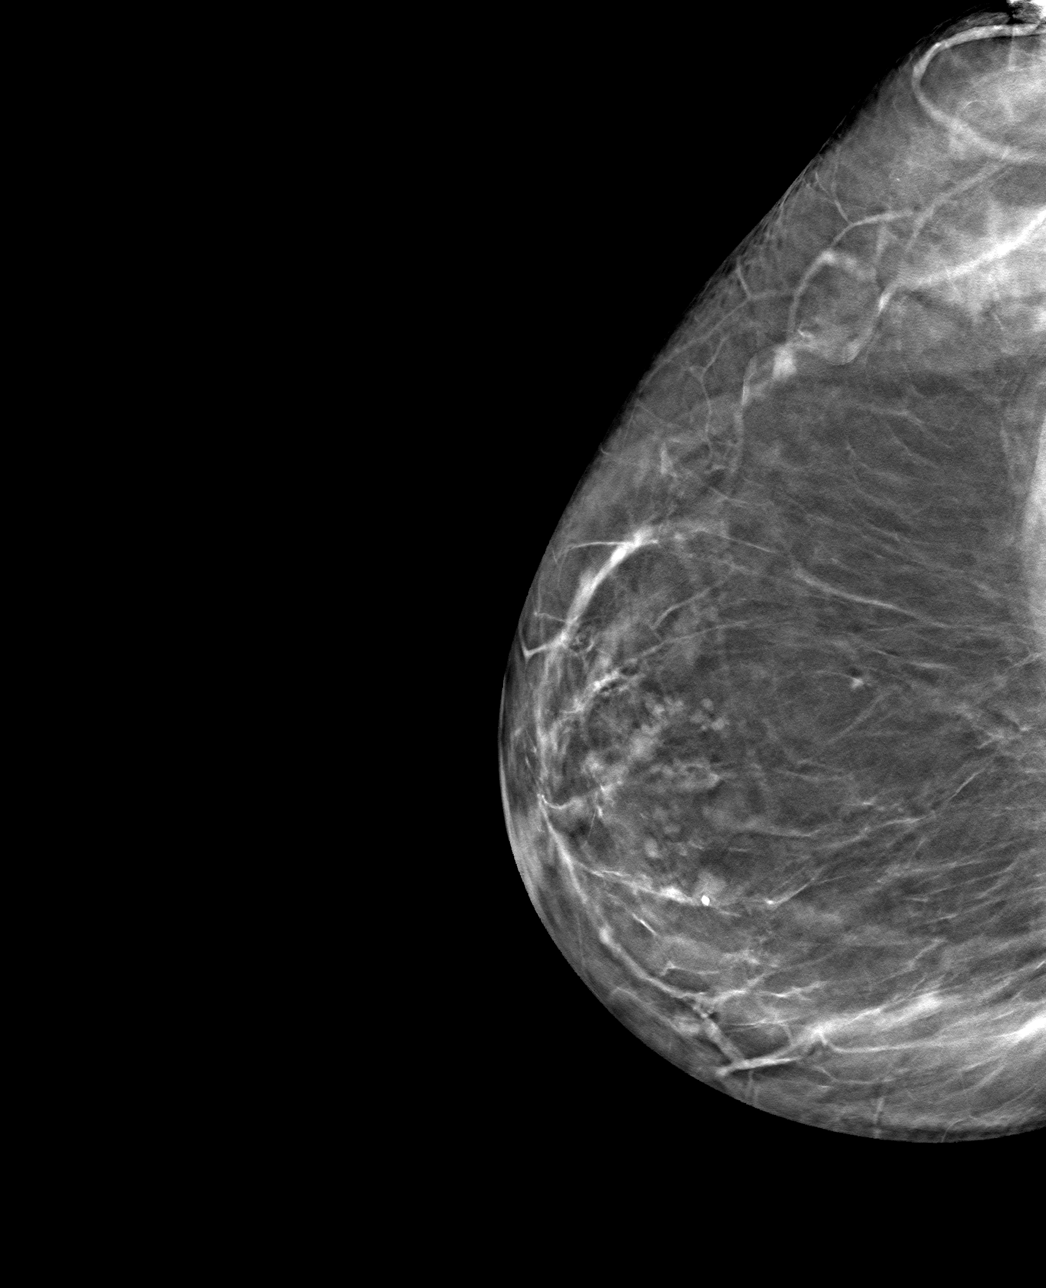

[R ML tomo · tomo slice 45/89.0]
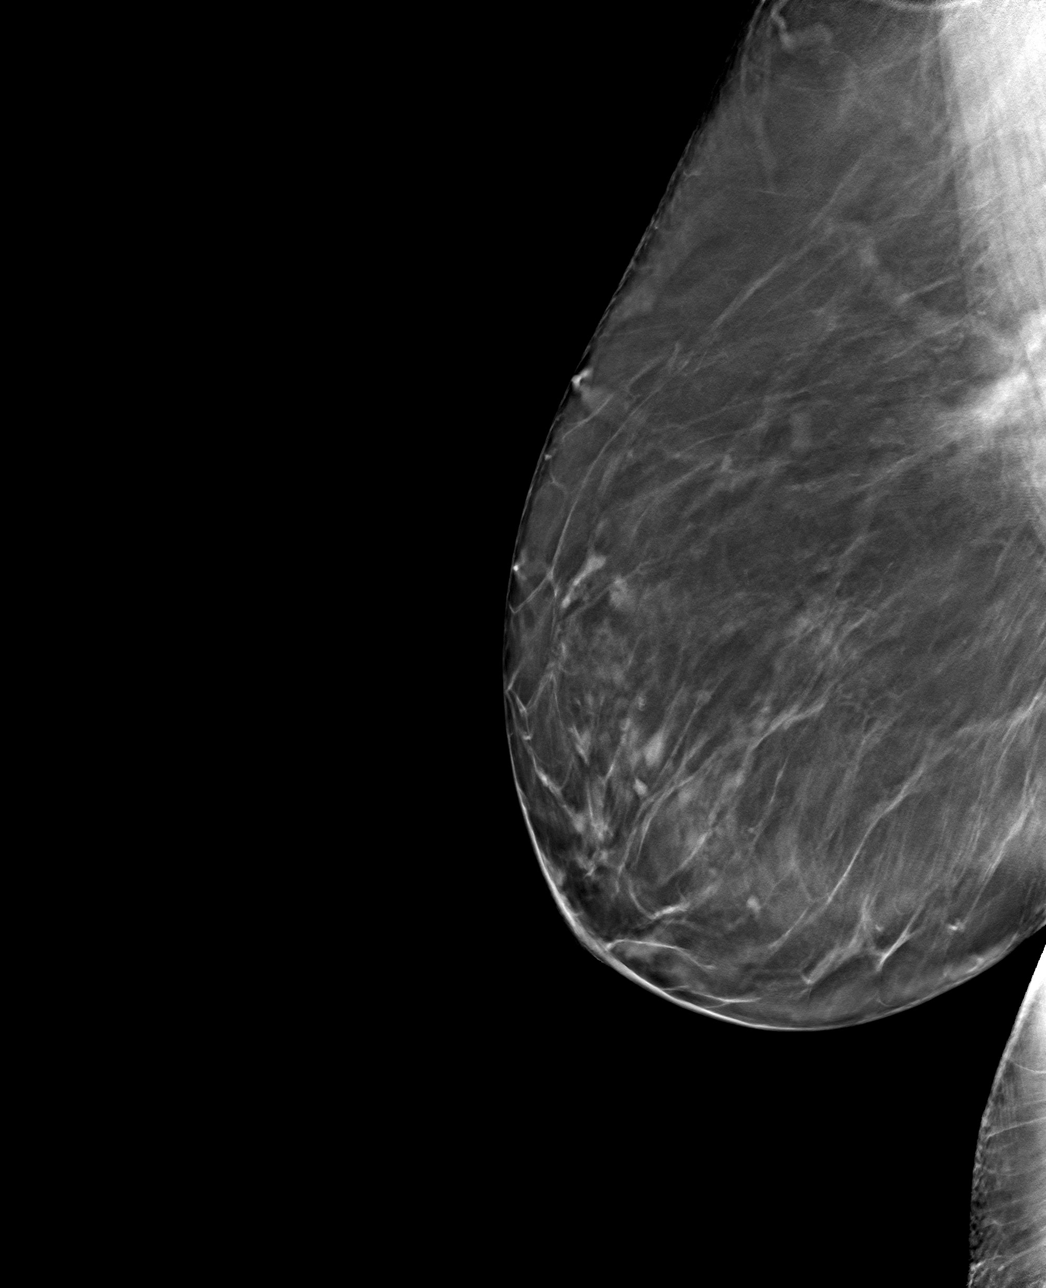

[4 of 12 positions shown; findings below may reference images not displayed]

FINDINGS: Mammographic images were obtained following ultrasound guided biopsy
of a right breast mass. The coil shaped biopsy clip lies in the
expected location of the mass. Mass is no longer visualized.
IMPRESSION: Well-positioned coil shaped biopsy clip following ultrasound-guided
core needle biopsy of the right breast.

Final Assessment: Post Procedure Mammograms for Marker Placement

## 2021-02-11 ENCOUNTER — Other Ambulatory Visit: Payer: Self-pay | Admitting: *Deleted

## 2021-02-11 MED ORDER — EXEMESTANE 25 MG PO TABS: 25.0000 mg | ORAL_TABLET | Freq: Every day | ORAL | 3 refills | Status: DC

## 2021-02-11 NOTE — Telephone Encounter (Signed)
Patient requests refill for Aromasin. Order pended for MD approval.

## 2021-04-15 ENCOUNTER — Encounter: Payer: Self-pay | Admitting: Oncology

## 2021-04-16 NOTE — Telephone Encounter (Signed)
Last visit was virtual and I need to do breast exam ideally

## 2021-05-10 ENCOUNTER — Inpatient Hospital Stay: Payer: Medicare PPO | Attending: Oncology | Admitting: Oncology

## 2021-05-10 ENCOUNTER — Encounter: Payer: Self-pay | Admitting: Oncology

## 2021-05-10 ENCOUNTER — Other Ambulatory Visit: Payer: Self-pay

## 2021-05-10 VITALS — BP 162/81 | HR 81 | Temp 98.2°F | Resp 16 | Wt 200.0 lb

## 2021-05-10 DIAGNOSIS — Z79811 Long term (current) use of aromatase inhibitors: Secondary | ICD-10-CM

## 2021-05-10 DIAGNOSIS — D0511 Intraductal carcinoma in situ of right breast: Secondary | ICD-10-CM | POA: Diagnosis not present

## 2021-05-10 DIAGNOSIS — Z170 Estrogen receptor positive status [ER+]: Secondary | ICD-10-CM | POA: Insufficient documentation

## 2021-05-10 NOTE — Progress Notes (Signed)
Pt in for follow up, still has issues with diarrhea from her intestinal surgery in the early 2000's.

## 2021-05-12 NOTE — Progress Notes (Signed)
Hematology/Oncology Consult note [facility]  Telephone:([phone] [phone]  Patient Care Team: Barbaraann Boys, MD as PCP - General (Pediatrics) Sindy Guadeloupe, MD as Consulting Physician (Oncology) Herbert Pun, MD as Consulting Physician (General Surgery) Rico Junker, RN as Registered Nurse   Name of the patient: Lauren Riley  [id number]  May 13, 1940   Date of visit: 05/12/21  Diagnosis-right breast DCIS  Chief complaint/ Reason for visit- routine follow-up of right breast DCIS ER positive   Heme/Onc history: patient is a 81 year old female who underwent a screening mammogram in September 2020 which showed a suspicious 9 mm mass in the right breast 9:30 position.  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  She was seen by Dr. 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  No evidence of invasive carcinoma.  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   Patient's case was discussed at tumor board and radiation therapy adjuvantly was not deemed to be necessary.  She was started on Arimidex in November 2020 which she did not tolerate due to worsening joint pain and back pain and was switched to Aromasin      Interval history-patient is tolerating Aromasin well other than mild self-limited joint pains and chronic mild fatigue.  ECOG PS- 1 Pain scale- 0   Review of systems- Review of Systems  Constitutional:  Positive for malaise/fatigue. Negative for chills, fever and weight loss.  HENT:  Negative for congestion, ear discharge and nosebleeds.   Eyes:  Negative for blurred vision.  Respiratory:  Negative for cough, hemoptysis, sputum production, shortness of  breath and wheezing.   Cardiovascular:  Negative for chest pain, palpitations, orthopnea and claudication.  Gastrointestinal:  Negative for abdominal pain, blood in stool, constipation, diarrhea, heartburn, melena, nausea and vomiting.  Genitourinary:  Negative for dysuria, flank pain, frequency, hematuria and urgency.  Musculoskeletal:  Positive for joint pain. Negative for back pain and myalgias.  Skin:  Negative for rash.  Neurological:  Negative for dizziness, tingling, focal weakness, seizures, weakness and headaches.  Endo/Heme/Allergies:  Does not bruise/bleed easily.  Psychiatric/Behavioral:  Negative for depression and suicidal ideas. The patient does not have insomnia.       Allergies  Allergen Reactions   Nsaids     Other reaction(s):  GI UPSET; CHEST BURNING FROM IBUPROFEN    Fenofibrate     Liver Disorder Elevated LFT's    Fish Oil Diarrhea    GI upset    Niacin     Flushing    Pravastatin Other (See Comments)    Mylagias    Tape Rash    ADHESIVE TAPE- OK W/ PAPER TAPES. ADHESIVE TAPE- OK W/ PAPER TAPES     Past Medical History:  Diagnosis Date   Anemia 2015   has had iron infusions   Asthma    Breast cancer ([place]) 01/2019   unsure if benign or malignant at this point in time   Cancer [facility])    skin ca   Chronic diarrhea    Dyspnea    with exercise   Dysrhythmia    GERD (gastroesophageal reflux disease)    Hypertension    IBS (irritable bowel syndrome)    Macular degeneration      Past Surgical History:  Procedure Laterality Date   ABDOMINAL  HYSTERECTOMY     BREAST BIOPSY Left    unsure which side and between 1974 and 1980 Negative was a cyst   BREAST BIOPSY Right 01/12/2019   intraductal papilloma   BREAST LUMPECTOMY Right 01/26/2019`   intraductal papilloma excisied.    [place]   diverticulitis that had ruptured   COLOSTOMY CLOSURE  1995   EYE SURGERY Bilateral    cataract extractions   FRACTURE SURGERY      femur, ulna and radius. metal removed   JOINT REPLACEMENT Bilateral 2013, 2015   bilateral knee   PARTIAL MASTECTOMY WITH NEEDLE LOCALIZATION Right 01/26/2019   Procedure: PARTIAL MASTECTOMY WITH NEEDLE LOCALIZATION;  Surgeon: Herbert Pun, MD;  Location: [facility] ORS;  Service: General;  Laterality: Right;   REPLACEMENT TOTAL KNEE Bilateral    TONSILLECTOMY      Social History   Socioeconomic History   Marital status: Married    Spouse name: Dan   Number of children: Not on file   Years of education: Not on file   Highest education level: Not on file  Occupational History   Occupation: school teacher    Comment: retired  Tobacco Use   Smoking status: Never   Smokeless tobacco: Never  Vaping Use   Vaping Use: Never used  Substance and Sexual Activity   Alcohol use: Never   Drug use: Never   Sexual activity: Not on file  Other Topics Concern   Not on file  Social History Narrative   Recently transplanted to live near son and his family.   One daughter died in an MVA that patient was also in.   Social Determinants of Health   Financial Resource Strain: Not on file  Food Insecurity: Not on file  Transportation Needs: Not on file  Physical Activity: Not on file  Stress: Not on file  Social Connections: Not on file  Intimate Partner Violence: Not on file    Family History  Problem Relation Age of Onset   Breast cancer Sister 40   Breast cancer Maternal Aunt        mat aunt   Parkinson's disease Mother    Congestive Heart Failure Father    Hypertension Father      Current Outpatient Medications:    Acetaminophen (TYLENOL 8 HOUR ARTHRITIS PAIN PO), Take 2 tablets by mouth in the morning and at bedtime., Disp: , Rfl:    calcium carbonate (OS-CAL) 600 MG TABS tablet, Take 600 mg by mouth daily with breakfast. , Disp: , Rfl:    Cholecalciferol (VITAMIN D3) 25 MCG (1000 UT) CAPS, Take 1 capsule by mouth daily., Disp: , Rfl:    cholestyramine (QUESTRAN) 4 g  packet, Take by mouth., Disp: , Rfl:    exemestane (AROMASIN) 25 MG tablet, Take 1 tablet (25 mg total) by mouth daily after breakfast., Disp: 90 tablet, Rfl: 3   loperamide (IMODIUM A-D) 2 MG tablet, Take 2 mg by mouth 3 (three) times daily as needed for diarrhea or loose stools. , Disp: , Rfl:    metoprolol succinate (TOPROL-XL) 100 MG 24 hr tablet, Take 100 mg by mouth daily. , Disp: , Rfl:    Multiple Vitamins-Minerals (PRESERVISION AREDS 2) CAPS, Take 1 capsule by mouth 2 (two) times daily. , Disp: , Rfl:    pantoprazole (PROTONIX) 40 MG tablet, Take 40 mg by mouth daily. , Disp: , Rfl:    SUMAtriptan (IMITREX) 50 MG tablet, Take by mouth. (Patient not  taking: Reported on 11/05/2020), Disp: , Rfl:   Physical exam:  Vitals:   05/10/21 1508  BP: (!) 162/81  Pulse: 81  Resp: 16  Temp: 98.2 F (36.8 C)  TempSrc: Tympanic  SpO2: 100%  Weight: 200 lb (90.7 kg)   Physical Exam Constitutional:      General: She is not in acute distress. Cardiovascular:     Rate and Rhythm: Normal rate and regular rhythm.     Heart sounds: Normal heart sounds.  Pulmonary:     Effort: Pulmonary effort is normal.     Breath sounds: Normal breath sounds.  Abdominal:     General: Bowel sounds are normal.     Palpations: Abdomen is soft.  Skin:    General: Skin is warm and dry.  Neurological:     Mental Status: She is alert and oriented to person, place, and time.    Breast exam was performed in seated and lying down position. Patient is status post right lumpectomy with a well-healed surgical scar. No evidence of any palpable masses. No evidence of axillary adenopathy. No evidence of any palpable masses or lumps in the left breast. No evidence of leftt axillary adenopathy  CMP Latest Ref Rng & Units 01/12/2020  Glucose 70 - 99 mg/dL 99  BUN 8 - 23 mg/dL 16  Creatinine 0.44 - 1.00 mg/dL 0.92  Sodium 135 - 145 mmol/L 140  Potassium 3.5 - 5.1 mmol/L 4.3  Chloride 98 - 111 mmol/L 105  CO2 22 - 32  mmol/L 27  Calcium 8.9 - 10.3 mg/dL 9.1  Total Protein 6.5 - 8.1 g/dL 6.5  Total Bilirubin 0.3 - 1.2 mg/dL 1.2  Alkaline Phos 38 - 126 U/L 60  AST 15 - 41 U/L 21  ALT 0 - 44 U/L 18   CBC Latest Ref Rng & Units 01/12/2020  WBC 4.0 - 10.5 K/uL 8.1  Hemoglobin 12.0 - 15.0 g/dL 12.7  Hematocrit 36.0 - 46.0 % 39.0  Platelets 150 - 400 K/uL 230     Assessment and plan- Patient is a 81 y.o. female with right breast DCIS ER positive here for a routine follow-up  Patient is tolerating Aromasin well and will continue to take it for 5 years ending in November 2025.She would be due for a repeat bone density scan at this time which we will order.  Clinically patient is doing well with no concerning signs and symptoms of recurrence based on today's exam.  I will see her back in 6 months for a video visit   Visit Diagnosis 1. Ductal carcinoma in situ (DCIS) of right breast   2. Use of exemestane (Aromasin)      Dr. Randa Evens, MD, MPH [facility] at [facility] [phone] 05/12/2021 10:28 AM

## 2021-06-19 ENCOUNTER — Other Ambulatory Visit: Payer: Medicare PPO

## 2021-07-11 ENCOUNTER — Ambulatory Visit
Admission: RE | Admit: 2021-07-11 | Discharge: 2021-07-11 | Disposition: A | Discharge status: Home / Self Care | Payer: Medicare PPO | Source: Ambulatory Visit | Attending: Oncology | Admitting: Oncology

## 2021-07-11 DIAGNOSIS — D0511 Intraductal carcinoma in situ of right breast: Secondary | ICD-10-CM | POA: Diagnosis present

## 2021-07-11 DIAGNOSIS — Z79811 Long term (current) use of aromatase inhibitors: Secondary | ICD-10-CM | POA: Diagnosis present

## 2021-11-05 ENCOUNTER — Other Ambulatory Visit: Payer: Self-pay | Admitting: Oncology

## 2021-11-08 ENCOUNTER — Inpatient Hospital Stay: Payer: Medicare PPO | Attending: Oncology | Admitting: Medical Oncology

## 2021-11-08 ENCOUNTER — Encounter: Payer: Self-pay | Admitting: Medical Oncology

## 2021-11-08 DIAGNOSIS — D0511 Intraductal carcinoma in situ of right breast: Secondary | ICD-10-CM | POA: Diagnosis not present

## 2021-11-08 DIAGNOSIS — Z79811 Long term (current) use of aromatase inhibitors: Secondary | ICD-10-CM

## 2021-11-08 DIAGNOSIS — M8580 Other specified disorders of bone density and structure, unspecified site: Secondary | ICD-10-CM | POA: Diagnosis not present

## 2021-11-08 NOTE — Progress Notes (Deleted)
Hematology/Oncology Consult note [facility]  [phone] [phone]  Patient Care Team: Barbaraann Boys, MD as PCP - General (Pediatrics) Sindy Guadeloupe, MD as Consulting Physician (Oncology) Herbert Pun, MD as Consulting Physician (General Surgery) Rico Junker, RN as Registered Nurse   Name of the patient: Lauren Riley  [id number]  1940-04-11   Date of visit: 11/08/21  Diagnosis-right breast DCIS  Chief complaint/ Reason for visit- routine follow-up of right breast DCIS ER positive   Heme/Onc history: patient is a 81 year old female who underwent a screening mammogram in September 2020 which showed a suspicious 9 mm mass in the right breast 9:30 position.  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  She was seen by Dr. 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  No evidence of invasive carcinoma.  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   Patient's case was discussed at tumor board and radiation therapy adjuvantly was not deemed to be necessary.  She was started on Arimidex in November 2020 which she did not tolerate due to worsening joint pain and back pain and was switched to Aromasin      Interval history- Patient reports that she is doing well. She continues to have some joint pains on the Aromasin but is otherwise doing well. She takes calcium and vitamin D daily. No recent bone fractures. DEXA completed on 07/11/2021. Mammogram UTD.   ECOG PS- 1 Pain scale- 0   Review of systems- Review of Systems  Constitutional:  Positive for malaise/fatigue. Negative for chills, fever and weight loss.  HENT:  Negative for congestion, ear discharge and  nosebleeds.   Eyes:  Negative for blurred vision.  Respiratory:  Negative for cough, hemoptysis, sputum production, shortness of breath and wheezing.   Cardiovascular:  Negative for chest pain, palpitations, orthopnea and claudication.  Gastrointestinal:  Negative for abdominal pain, blood in stool, constipation, diarrhea, heartburn, melena, nausea and vomiting.  Genitourinary:  Negative for dysuria, flank pain, frequency, hematuria and urgency.  Musculoskeletal:  Positive for joint pain. Negative for back pain and myalgias.  Skin:  Negative for rash.  Neurological:  Negative for dizziness, tingling, focal weakness, seizures, weakness and headaches.  Endo/Heme/Allergies:  Does not bruise/bleed easily.  Psychiatric/Behavioral:  Negative for depression and suicidal ideas. The patient does not have insomnia.        Allergies  Allergen Reactions   Nsaids     Other reaction(s):  GI UPSET; CHEST BURNING FROM IBUPROFEN    Fenofibrate     Liver Disorder Elevated LFT's    Fish Oil Diarrhea    GI upset    Niacin     Flushing    Pravastatin Other (See Comments)    Mylagias    Tape Rash    ADHESIVE TAPE- OK W/ PAPER TAPES. ADHESIVE TAPE- OK W/ PAPER TAPES     Past Medical History:  Diagnosis Date   Anemia 2015   has had iron infusions   Asthma    Breast cancer ([place]) 01/2019   unsure if benign or malignant at this point in time   Cancer [facility])    skin ca   Chronic diarrhea    Dyspnea    with exercise   Dysrhythmia    GERD (gastroesophageal reflux disease)    Hypertension  IBS (irritable bowel syndrome)    Macular degeneration      Past Surgical History:  Procedure Laterality Date   ABDOMINAL HYSTERECTOMY     BREAST BIOPSY Left    unsure which side and between 1974 and 1980 Negative was a cyst   BREAST BIOPSY Right 01/12/2019   intraductal papilloma   BREAST LUMPECTOMY Right 01/26/2019`   intraductal papilloma excisied.    [place]    diverticulitis that had ruptured   COLOSTOMY CLOSURE  1995   EYE SURGERY Bilateral    cataract extractions   FRACTURE SURGERY     femur, ulna and radius. metal removed   JOINT REPLACEMENT Bilateral 2013, 2015   bilateral knee   PARTIAL MASTECTOMY WITH NEEDLE LOCALIZATION Right 01/26/2019   Procedure: PARTIAL MASTECTOMY WITH NEEDLE LOCALIZATION;  Surgeon: Herbert Pun, MD;  Location: [facility] ORS;  Service: General;  Laterality: Right;   REPLACEMENT TOTAL KNEE Bilateral    TONSILLECTOMY      Social History   Socioeconomic History   Marital status: Married    Spouse name: Dan   Number of children: Not on file   Years of education: Not on file   Highest education level: Not on file  Occupational History   Occupation: school teacher    Comment: retired  Tobacco Use   Smoking status: Never   Smokeless tobacco: Never  Vaping Use   Vaping Use: Never used  Substance and Sexual Activity   Alcohol use: Never   Drug use: Never   Sexual activity: Not on file  Other Topics Concern   Not on file  Social History Narrative   Recently transplanted to live near son and his family.   One daughter died in an MVA that patient was also in.   Social Determinants of Health   Financial Resource Strain: Not on file  Food Insecurity: Not on file  Transportation Needs: Not on file  Physical Activity: Not on file  Stress: Not on file  Social Connections: Not on file  Intimate Partner Violence: Not on file    Family History  Problem Relation Age of Onset   Breast cancer Sister 84   Breast cancer Maternal Aunt        mat aunt   Parkinson's disease Mother    Congestive Heart Failure Father    Hypertension Father      Current Outpatient Medications:    Acetaminophen (TYLENOL 8 HOUR ARTHRITIS PAIN PO), Take 2 tablets by mouth in the morning and at bedtime., Disp: , Rfl:    calcium carbonate (OS-CAL) 600 MG TABS tablet, Take 600 mg by mouth daily with breakfast. , Disp: , Rfl:     Cholecalciferol (VITAMIN D3) 25 MCG (1000 UT) CAPS, Take 1 capsule by mouth daily., Disp: , Rfl:    cholestyramine (QUESTRAN) 4 g packet, Take by mouth., Disp: , Rfl:    diphenoxylate-atropine (LOMOTIL) 2.5-0.025 MG tablet, Take by mouth., Disp: , Rfl:    exemestane (AROMASIN) 25 MG tablet, TAKE 1 TABLET(25 MG) BY MOUTH DAILY AFTER BREAKFAST, Disp: 90 tablet, Rfl: 3   lipase/protease/amylase (CREON) 36000 UNITS CPEP capsule, Take by mouth., Disp: , Rfl:    loperamide (IMODIUM A-D) 2 MG tablet, Take 2 mg by mouth 3 (three) times daily as needed for diarrhea or loose stools. , Disp: , Rfl:    Melatonin 10 MG TABS, Take by mouth., Disp: , Rfl:    metoprolol succinate (TOPROL-XL) 100 MG 24 hr  tablet, Take 100 mg by mouth daily. , Disp: , Rfl:    Multiple Vitamins-Minerals (PRESERVISION AREDS 2) CAPS, Take 1 capsule by mouth 2 (two) times daily. , Disp: , Rfl:    pantoprazole (PROTONIX) 40 MG tablet, Take 40 mg by mouth daily. , Disp: , Rfl:    SUMAtriptan (IMITREX) 50 MG tablet, Take by mouth., Disp: , Rfl:   Physical exam:  There were no vitals filed for this visit.  Physical Exam Constitutional:      General: She is not in acute distress. Cardiovascular:     Rate and Rhythm: Normal rate and regular rhythm.     Heart sounds: Normal heart sounds.  Pulmonary:     Effort: Pulmonary effort is normal.     Breath sounds: Normal breath sounds.  Abdominal:     General: Bowel sounds are normal.     Palpations: Abdomen is soft.  Skin:    General: Skin is warm and dry.  Neurological:     Mental Status: She is alert and oriented to person, place, and time.     Breast exam was performed in seated and lying down position. Patient is status post right lumpectomy with a well-healed surgical scar. No evidence of any palpable masses. No evidence of axillary adenopathy. No evidence of any palpable masses or lumps in the left breast. No evidence of leftt axillary adenopathy     Latest Ref Rng & Units  01/12/2020   11:16 AM  CMP  Glucose 70 - 99 mg/dL 99   BUN 8 - 23 mg/dL 16   Creatinine 0.44 - 1.00 mg/dL 0.92   Sodium 135 - 145 mmol/L 140   Potassium 3.5 - 5.1 mmol/L 4.3   Chloride 98 - 111 mmol/L 105   CO2 22 - 32 mmol/L 27   Calcium 8.9 - 10.3 mg/dL 9.1   Total Protein 6.5 - 8.1 g/dL 6.5   Total Bilirubin 0.3 - 1.2 mg/dL 1.2   Alkaline Phos 38 - 126 U/L 60   AST 15 - 41 U/L 21   ALT 0 - 44 U/L 18       Latest Ref Rng & Units 01/12/2020   11:16 AM  CBC  WBC 4.0 - 10.5 K/uL 8.1   Hemoglobin 12.0 - 15.0 g/dL 12.7   Hematocrit 36.0 - 46.0 % 39.0   Platelets 150 - 400 K/uL 230      Assessment and plan- Patient is a 81 y.o. female with right breast DCIS ER positive here for a routine follow-up  Tolerating her Aromasin well-5 year treatment plan will end November 2025.  We reviewed her recent bone density scan which did show a decrease in her T score from -2.1 to -2.4.  We discussed interventions with items such as Fosamax or Prolia including risks and benefits.  We discussed that she would need dental clearance.  At this time patient would like to continue active surveillance along with calcium, vitamin D and will start weightbearing exercises that we discussed.  She will increase her vitamin D intake from 1000 international units once daily to 2000 international units once daily based on her low normal vitamin D level that was obtained recently by her PCP.  She will be due for her mammogram this fall.  Our plan will be to reimage with a DEXA scan in 1 year given her PPI use, worsening scores and aromatase inhibitor use.  Should her insurance not cover this then we will defer to a 2-year monitoring.  Unless she has a fracture in the meantime.   Visit Diagnosis 1. Ductal carcinoma in situ (DCIS) of right breast   2. Use of exemestane (Aromasin)   3. Osteopenia, unspecified location     Minna Antis [facility] 11/08/2021 12:06 PM

## 2021-11-08 NOTE — Progress Notes (Signed)
Virtual Visit Progress Note  Lauren Riley,you are scheduled for a virtual visit with your provider today.    Just as we do with appointments in the office, we must obtain your consent to participate.  Your consent will be active for this visit and any virtual visit you may have with one of our providers in the next 365 days.    If you have a MyChart account, I can also send a copy of this consent to you electronically.  All virtual visits are billed to your insurance company just like a traditional visit in the office.  As this is a virtual visit, video technology does not allow for your provider to perform a traditional examination.  This may limit your provider's ability to fully assess your condition.  If your provider identifies any concerns that need to be evaluated in person or the need to arrange testing such as labs, EKG, etc, we will make arrangements to do so.    Although advances in technology are sophisticated, we cannot ensure that it will always work on either your end or our end.  If the connection with a video visit is poor, we may have to switch to a telephone visit.  With either a video or telephone visit, we are not always able to ensure that we have a secure connection.   I need to obtain your verbal consent now.   Are you willing to proceed with your visit today?   Lauren Riley has provided verbal consent on 11/08/2021 for a virtual visit (video or telephone).   Hughie Closs, PA-C 11/08/2021  12:07 PM    I connected with Lauren Riley on 11/08/21 at 11:30 AM EDT by video enabled telemedicine visit and verified that I am speaking with the correct person using two identifiers.   I discussed the limitations, risks, security and privacy concerns of performing an evaluation and management service by telemedicine and the availability of in-person appointments. I also discussed with the patient that there may be a patient responsible charge related to this service. The patient  expressed understanding and agreed to proceed.   Other persons participating in the visit and their role in the encounter: None  Patient's location: Home Provider's location: Clinic   Chief Complaint: DCIS follow up  Heme/Onc history: patient is a 81 year old female who underwent a screening mammogram in September 2020 which showed a suspicious 9 mm mass in the right breast 9:30 position.  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  She was seen by Dr. 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  No evidence of invasive carcinoma.  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   Patient's case was discussed at tumor board and radiation therapy adjuvantly was not deemed to be necessary.  She was started on Arimidex in November 2020 which she did not tolerate due to worsening joint pain and back pain and was switched to Aromasin  Interval History: Patient reports that she is doing well. She continues to have some joint pains on the Aromasin but is otherwise doing well. She takes calcium and vitamin D daily. No recent bone fractures. DEXA completed on 07/11/2021. Mammogram UTD.    Patient Care Team: Barbaraann Boys, MD as PCP - General (Pediatrics) Sindy Guadeloupe, MD as Consulting Physician (Oncology) Herbert Pun,  MD as Consulting Physician (General Surgery) Rico Junker, RN as Registered Nurse   Name of the patient: Lauren Riley  [id number]  [birth year]   Date of visit: 11/08/21  History of Presenting Illness-   Review of systems- ROS   Allergies  Allergen Reactions   Nsaids     Other reaction(s):  GI UPSET; CHEST BURNING FROM IBUPROFEN    Fenofibrate     Liver Disorder Elevated LFT's    Fish Oil  Diarrhea    GI upset    Niacin     Flushing    Pravastatin Other (See Comments)    Mylagias    Tape Rash    ADHESIVE TAPE- OK W/ PAPER TAPES. ADHESIVE TAPE- OK W/ PAPER TAPES    Past Medical History:  Diagnosis Date   Anemia 2015   has had iron infusions   Asthma    Breast cancer ([place]) 01/2019   unsure if benign or malignant at this point in time   Cancer [facility])    skin ca   Chronic diarrhea    Dyspnea    with exercise   Dysrhythmia    GERD (gastroesophageal reflux disease)    Hypertension    IBS (irritable bowel syndrome)    Macular degeneration     Past Surgical History:  Procedure Laterality Date   ABDOMINAL HYSTERECTOMY     BREAST BIOPSY Left    unsure which side and between 1974 and 1980 Negative was a cyst   BREAST BIOPSY Right 01/12/2019   intraductal papilloma   BREAST LUMPECTOMY Right 01/26/2019`   intraductal papilloma excisied.    [place]   diverticulitis that had ruptured   COLOSTOMY CLOSURE  1995   EYE SURGERY Bilateral    cataract extractions   FRACTURE SURGERY     femur, ulna and radius. metal removed   JOINT REPLACEMENT Bilateral 2013, 2015   bilateral knee   PARTIAL MASTECTOMY WITH NEEDLE LOCALIZATION Right 01/26/2019   Procedure: PARTIAL MASTECTOMY WITH NEEDLE LOCALIZATION;  Surgeon: Herbert Pun, MD;  Location: [facility] ORS;  Service: General;  Laterality: Right;   REPLACEMENT TOTAL KNEE Bilateral    TONSILLECTOMY      Social History   Socioeconomic History   Marital status: Married    Spouse name: Dan   Number of children: Not on file   Years of education: Not on file   Highest education level: Not on file  Occupational History   Occupation: school teacher    Comment: retired  Tobacco Use   Smoking status: Never   Smokeless tobacco: Never  Vaping Use   Vaping Use: Never used  Substance and Sexual Activity   Alcohol use: Never   Drug use: Never   Sexual activity: Not on file  Other  Topics Concern   Not on file  Social History Narrative   Recently transplanted to live near son and his family.   One daughter died in an MVA that patient was also in.   Social Determinants of Health   Financial Resource Strain: Not on file  Food Insecurity: Not on file  Transportation Needs: Not on file  Physical Activity: Not on file  Stress: Not on file  Social Connections: Not on file  Intimate Partner Violence: Not on file    Immunization History  Administered Date(s) Administered   Fluad Quad(high Dose 65+) 02/01/2019   Influenza, High Dose Seasonal PF 12/30/2013, 12/30/2013, 12/27/2014, 12/27/2014, 01/15/2016, 12/13/2016, 01/22/2018   Influenza-Unspecified  04/19/2008   PFIZER Comirnaty(Gray Top)Covid-19 Tri-Sucrose Vaccine 04/13/2019, 05/04/2019, 07/31/2020   PFIZER(Purple Top)SARS-COV-2 Vaccination 04/13/2019, 05/04/2019   Pneumococcal Conjugate-13 09/15/2014, 09/15/2014   Pneumococcal Polysaccharide-23 09/07/2006, 09/07/2006   Td 08/29/1988   Tdap 12/13/2016   Zoster Recombinat (Shingrix) 01/01/2017, 03/06/2017    Family History  Problem Relation Age of Onset   Breast cancer Sister 34   Breast cancer Maternal Aunt        mat aunt   Parkinson's disease Mother    Congestive Heart Failure Father    Hypertension Father      Current Outpatient Medications:    Acetaminophen (TYLENOL 8 HOUR ARTHRITIS PAIN PO), Take 2 tablets by mouth in the morning and at bedtime., Disp: , Rfl:    calcium carbonate (OS-CAL) 600 MG TABS tablet, Take 600 mg by mouth daily with breakfast. , Disp: , Rfl:    Cholecalciferol (VITAMIN D3) 25 MCG (1000 UT) CAPS, Take 1 capsule by mouth daily., Disp: , Rfl:    cholestyramine (QUESTRAN) 4 g packet, Take by mouth., Disp: , Rfl:    diphenoxylate-atropine (LOMOTIL) 2.5-0.025 MG tablet, Take by mouth., Disp: , Rfl:    exemestane (AROMASIN) 25 MG tablet, TAKE 1 TABLET(25 MG) BY MOUTH DAILY AFTER BREAKFAST, Disp: 90 tablet, Rfl: 3    lipase/protease/amylase (CREON) 36000 UNITS CPEP capsule, Take by mouth., Disp: , Rfl:    loperamide (IMODIUM A-D) 2 MG tablet, Take 2 mg by mouth 3 (three) times daily as needed for diarrhea or loose stools. , Disp: , Rfl:    Melatonin 10 MG TABS, Take by mouth., Disp: , Rfl:    metoprolol succinate (TOPROL-XL) 100 MG 24 hr tablet, Take 100 mg by mouth daily. , Disp: , Rfl:    Multiple Vitamins-Minerals (PRESERVISION AREDS 2) CAPS, Take 1 capsule by mouth 2 (two) times daily. , Disp: , Rfl:    pantoprazole (PROTONIX) 40 MG tablet, Take 40 mg by mouth daily. , Disp: , Rfl:    SUMAtriptan (IMITREX) 50 MG tablet, Take by mouth., Disp: , Rfl:   Physical exam: Exam limited due to telemedicine Physical Exam    Assessment and plan- Patient is a 81 y.o. female    Visit Diagnosis 1. Ductal carcinoma in situ (DCIS) of right breast   2. Use of exemestane (Aromasin)   3. Osteopenia, unspecified location     Tolerating her Aromasin well-5 year treatment plan will end November 2025.  We reviewed her recent bone density scan which did show a decrease in her T score from -2.1 to -2.4.  We discussed interventions with items such as Fosamax or Prolia including risks and benefits.  We discussed that she would need dental clearance.  At this time patient would like to continue active surveillance along with calcium, vitamin D and will start weightbearing exercises that we discussed.  She will increase her vitamin D intake from 1000 international units once daily to 2000 international units once daily based on her low normal vitamin D level that was obtained recently by her PCP.  She will be due for her mammogram this fall.  Our plan will be to reimage with a DEXA scan in 1 year given her PPI use, worsening scores and aromatase inhibitor use.  Should her insurance not cover this then we will defer to a 2-year monitoring.  Unless she has a fracture in the meantime.  Patient expressed understanding and was in  agreement with this plan. She also understands that She can call clinic at any time  with any questions, concerns, or complaints.   I discussed the assessment and treatment plan with the patient. The patient was provided an opportunity to ask questions and all were answered. The patient agreed with the plan and demonstrated an understanding of the instructions.   The patient was advised to call back or seek an in-person evaluation if the symptoms worsen or if the condition fails to improve as anticipated.   I spent 15 minutes face-to-face video visit time dedicated to the care of this patient on the date of this encounter to include pre-visit review of recent labs, DEXA, mammogram, face-to-face time with the patient, and post visit ordering of testing/documentation.   Thank you for allowing me to participate in the care of this very pleasant patient.   [facility]  CC:

## 2021-12-04 ENCOUNTER — Other Ambulatory Visit: Payer: Self-pay | Admitting: General Surgery

## 2021-12-04 DIAGNOSIS — Z1231 Encounter for screening mammogram for malignant neoplasm of breast: Secondary | ICD-10-CM

## 2022-01-21 ENCOUNTER — Ambulatory Visit
Admission: RE | Admit: 2022-01-21 | Discharge: 2022-01-21 | Disposition: A | Discharge status: Home / Self Care | Payer: Medicare PPO | Source: Ambulatory Visit | Attending: General Surgery | Admitting: General Surgery

## 2022-01-21 DIAGNOSIS — Z1231 Encounter for screening mammogram for malignant neoplasm of breast: Secondary | ICD-10-CM | POA: Diagnosis present

## 2022-01-30 ENCOUNTER — Ambulatory Visit: Payer: Medicare PPO | Admitting: Physical Therapy

## 2022-02-19 ENCOUNTER — Telehealth: Payer: Self-pay | Admitting: *Deleted

## 2022-02-19 NOTE — Telephone Encounter (Signed)
Lauren Riley- This has happened before to other patients as well. Please see what can be done at our end

## 2022-02-19 NOTE — Telephone Encounter (Signed)
Patient called up set that she is in collections over a survivorship encounter. She states that she received a call from a nurse and was asked if she wanted to participate in a video visit, but was never told that there was a charge for it She was billed for a co pay and told that te visit had been coded wrong and she should not have been charge a co pay when her insurance and her had a 3 way call with [facility]. She states that she is not happy wih that either and that she did not want her insurance billed for that visit either and that had she been told that there is a charge for it that she would not have agreed to participate in it. This is from a May 2021 encounter. Please advise what or if can be done about this

## 2022-04-28 DIAGNOSIS — J3489 Other specified disorders of nose and nasal sinuses: Secondary | ICD-10-CM | POA: Diagnosis not present

## 2022-04-28 DIAGNOSIS — H6121 Impacted cerumen, right ear: Secondary | ICD-10-CM | POA: Diagnosis not present

## 2022-04-28 DIAGNOSIS — J300 Vasomotor rhinitis: Secondary | ICD-10-CM | POA: Diagnosis not present

## 2022-04-28 DIAGNOSIS — D3705 Neoplasm of uncertain behavior of pharynx: Secondary | ICD-10-CM | POA: Diagnosis not present

## 2022-05-13 ENCOUNTER — Inpatient Hospital Stay: Payer: Medicare HMO | Attending: Oncology | Admitting: Oncology

## 2022-05-13 ENCOUNTER — Ambulatory Visit: Payer: Medicare PPO | Admitting: Oncology

## 2022-05-13 ENCOUNTER — Encounter: Payer: Self-pay | Admitting: Oncology

## 2022-05-13 VITALS — BP 164/77 | HR 84 | Temp 97.2°F | Resp 18 | Ht 62.0 in | Wt 193.0 lb

## 2022-05-13 DIAGNOSIS — Z79811 Long term (current) use of aromatase inhibitors: Secondary | ICD-10-CM | POA: Insufficient documentation

## 2022-05-13 DIAGNOSIS — D385 Neoplasm of uncertain behavior of other respiratory organs: Secondary | ICD-10-CM | POA: Diagnosis not present

## 2022-05-13 DIAGNOSIS — M8589 Other specified disorders of bone density and structure, multiple sites: Secondary | ICD-10-CM | POA: Diagnosis not present

## 2022-05-13 DIAGNOSIS — D0511 Intraductal carcinoma in situ of right breast: Secondary | ICD-10-CM | POA: Diagnosis not present

## 2022-05-13 DIAGNOSIS — Z86000 Personal history of in-situ neoplasm of breast: Secondary | ICD-10-CM

## 2022-05-13 DIAGNOSIS — Z08 Encounter for follow-up examination after completed treatment for malignant neoplasm: Secondary | ICD-10-CM | POA: Diagnosis not present

## 2022-05-13 DIAGNOSIS — M85852 Other specified disorders of bone density and structure, left thigh: Secondary | ICD-10-CM

## 2022-05-13 MED ORDER — EXEMESTANE 25 MG PO TABS: 25.0000 mg | ORAL_TABLET | Freq: Every day | ORAL | 3 refills | Status: DC

## 2022-05-13 NOTE — Progress Notes (Signed)
Patient does not have any concerns today.

## 2022-05-13 NOTE — Addendum Note (Signed)
Addended by: Luella Cook on: 05/13/2022 01:27 PM   Modules accepted: Orders

## 2022-05-13 NOTE — Progress Notes (Signed)
Hematology/Oncology Consult note [facility]  Telephone:([phone] [phone]  Patient Care Team: Barbaraann Boys, MD as PCP - General (Pediatrics) Sindy Guadeloupe, MD as Consulting Physician (Oncology) Herbert Pun, MD as Consulting Physician (General Surgery) Rico Junker, RN as Registered Nurse   Name of the patient: Lauren Riley  [id number]  May 27, 1940   Date of visit: 05/13/22  Diagnosis-right breast DCIS  Chief complaint/ Reason for visit-routine follow-up of DCIS on exemestane  Heme/Onc history: patient is a 82 year old female who underwent a screening mammogram in September 2020 which showed a suspicious 9 mm mass in the right breast 9:30 position.  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  She was seen by Dr. 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  No evidence of invasive carcinoma.  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   Patient's case was discussed at tumor board and radiation therapy adjuvantly was not deemed to be necessary.  She was started on Arimidex in November 2020 which she did not tolerate due to worsening joint pain and back pain and was switched to Aromasin    Interval history-tolerating exemestane well without any significant side effects.  She has mild fatigue and arthralgias does not affect her quality of life significantly.  She is compliant with calcium and vitamin D  ECOG PS- 1 Pain scale- 0   Review of systems- Review of Systems  Constitutional:  Positive for malaise/fatigue. Negative for chills, fever and weight loss.  HENT:  Negative for congestion, ear discharge and nosebleeds.   Eyes:  Negative for blurred vision.   Respiratory:  Negative for cough, hemoptysis, sputum production, shortness of breath and wheezing.   Cardiovascular:  Negative for chest pain, palpitations, orthopnea and claudication.  Gastrointestinal:  Negative for abdominal pain, blood in stool, constipation, diarrhea, heartburn, melena, nausea and vomiting.  Genitourinary:  Negative for dysuria, flank pain, frequency, hematuria and urgency.  Musculoskeletal:  Negative for back pain, joint pain and myalgias.  Skin:  Negative for rash.  Neurological:  Negative for dizziness, tingling, focal weakness, seizures, weakness and headaches.  Endo/Heme/Allergies:  Does not bruise/bleed easily.  Psychiatric/Behavioral:  Negative for depression and suicidal ideas. The patient does not have insomnia.       Allergies  Allergen Reactions   Nsaids     Other reaction(s):  GI UPSET; CHEST BURNING FROM IBUPROFEN    Fenofibrate     Liver Disorder Elevated LFT's    Fish Oil Diarrhea    GI upset    Niacin     Flushing    Pravastatin Other (See Comments)    Mylagias    Tape Rash    ADHESIVE TAPE- OK W/ PAPER TAPES. ADHESIVE TAPE- OK W/ PAPER TAPES     Past Medical History:  Diagnosis Date   Anemia 2015   has had iron infusions   Asthma    Breast cancer ([place]) 01/2019   unsure if benign or malignant at this point in time   Cancer [facility])    skin ca   Chronic diarrhea    Dyspnea    with exercise   Dysrhythmia    GERD (gastroesophageal reflux disease)    Hypertension    IBS (irritable bowel syndrome)    Macular degeneration      Past  Surgical History:  Procedure Laterality Date   ABDOMINAL HYSTERECTOMY     BREAST BIOPSY Left    unsure which side and between 1974 and 1980 Negative was a cyst   BREAST BIOPSY Right 01/12/2019   intraductal papilloma   BREAST LUMPECTOMY Right 01/26/2019`   intraductal papilloma excisied.    [place]   diverticulitis that had ruptured   COLOSTOMY CLOSURE  1995    EYE SURGERY Bilateral    cataract extractions   FRACTURE SURGERY     femur, ulna and radius. metal removed   JOINT REPLACEMENT Bilateral 2013, 2015   bilateral knee   PARTIAL MASTECTOMY WITH NEEDLE LOCALIZATION Right 01/26/2019   Procedure: PARTIAL MASTECTOMY WITH NEEDLE LOCALIZATION;  Surgeon: Herbert Pun, MD;  Location: [facility] ORS;  Service: General;  Laterality: Right;   REPLACEMENT TOTAL KNEE Bilateral    TONSILLECTOMY      Social History   Socioeconomic History   Marital status: Married    Spouse name: Dan   Number of children: Not on file   Years of education: Not on file   Highest education level: Not on file  Occupational History   Occupation: school teacher    Comment: retired  Tobacco Use   Smoking status: Never   Smokeless tobacco: Never  Vaping Use   Vaping Use: Never used  Substance and Sexual Activity   Alcohol use: Never   Drug use: Never   Sexual activity: Not on file  Other Topics Concern   Not on file  Social History Narrative   Recently transplanted to live near son and his family.   One daughter died in an MVA that patient was also in.   Social Determinants of Health   Financial Resource Strain: Not on file  Food Insecurity: Not on file  Transportation Needs: Not on file  Physical Activity: Not on file  Stress: Not on file  Social Connections: Not on file  Intimate Partner Violence: Not on file    Family History  Problem Relation Age of Onset   Breast cancer Sister 55   Breast cancer Maternal Aunt        mat aunt   Parkinson's disease Mother    Congestive Heart Failure Father    Hypertension Father      Current Outpatient Medications:    Acetaminophen (TYLENOL 8 HOUR ARTHRITIS PAIN PO), Take 2 tablets by mouth in the morning and at bedtime., Disp: , Rfl:    calcium carbonate (OS-CAL) 600 MG TABS tablet, Take 600 mg by mouth daily with breakfast. , Disp: , Rfl:    Cholecalciferol (VITAMIN D3) 25 MCG (1000 UT) CAPS, Take 1  capsule by mouth daily., Disp: , Rfl:    cholestyramine (QUESTRAN) 4 g packet, Take by mouth., Disp: , Rfl:    diphenoxylate-atropine (LOMOTIL) 2.5-0.025 MG tablet, Take by mouth., Disp: , Rfl:    exemestane (AROMASIN) 25 MG tablet, TAKE 1 TABLET(25 MG) BY MOUTH DAILY AFTER BREAKFAST, Disp: 90 tablet, Rfl: 3   lipase/protease/amylase (CREON) 36000 UNITS CPEP capsule, Take by mouth., Disp: , Rfl:    loperamide (IMODIUM A-D) 2 MG tablet, Take 2 mg by mouth 3 (three) times daily as needed for diarrhea or loose stools. , Disp: , Rfl:    Melatonin 10 MG TABS, Take by mouth., Disp: , Rfl:    metoprolol succinate (TOPROL-XL) 100 MG 24 hr tablet, Take 100 mg by mouth daily. , Disp: , Rfl:    Multiple  Vitamins-Minerals (PRESERVISION AREDS 2) CAPS, Take 1 capsule by mouth 2 (two) times daily. , Disp: , Rfl:    pantoprazole (PROTONIX) 40 MG tablet, Take 40 mg by mouth daily. , Disp: , Rfl:    SUMAtriptan (IMITREX) 50 MG tablet, Take by mouth., Disp: , Rfl:   Physical exam:  Vitals:   05/13/22 1141  BP: (!) 164/77  Pulse: 84  Resp: 18  Temp: (!) 97.2 F (36.2 C)  TempSrc: Tympanic  SpO2: 100%  Weight: 193 lb (87.5 kg)  Height: '5\' 2"'$  (1.575 m)   Physical Exam Cardiovascular:     Rate and Rhythm: Normal rate and regular rhythm.     Heart sounds: Normal heart sounds.  Pulmonary:     Effort: Pulmonary effort is normal.     Breath sounds: Normal breath sounds.  Skin:    General: Skin is warm and dry.  Neurological:     Mental Status: She is alert and oriented to person, place, and time.    Breast exam was performed in seated and lying down position. Patient is status post right lumpectomy with a well-healed surgical scar. No evidence of any palpable masses. No evidence of axillary adenopathy. No evidence of any palpable masses or lumps in the left breast. No evidence of leftt axillary adenopathy      Latest Ref Rng & Units 01/12/2020   11:16 AM  CMP  Glucose 70 - 99 mg/dL 99   BUN 8 - 23  mg/dL 16   Creatinine 0.44 - 1.00 mg/dL 0.92   Sodium 135 - 145 mmol/L 140   Potassium 3.5 - 5.1 mmol/L 4.3   Chloride 98 - 111 mmol/L 105   CO2 22 - 32 mmol/L 27   Calcium 8.9 - 10.3 mg/dL 9.1   Total Protein 6.5 - 8.1 g/dL 6.5   Total Bilirubin 0.3 - 1.2 mg/dL 1.2   Alkaline Phos 38 - 126 U/L 60   AST 15 - 41 U/L 21   ALT 0 - 44 U/L 18       Latest Ref Rng & Units 01/12/2020   11:16 AM  CBC  WBC 4.0 - 10.5 K/uL 8.1   Hemoglobin 12.0 - 15.0 g/dL 12.7   Hematocrit 36.0 - 46.0 % 39.0   Platelets 150 - 400 K/uL 230      Assessment and plan- Patient is a 82 y.o. female with history of right breast DCIS ER positive here for routine follow-up  Clinically patient is doing well concerning signs and symptoms of recurrence based on today's exam.  She will complete 5 years of Aromasin in November 2025.  She had a bone density scan last year which was compared to the one in 2020 which showed osteopenia in the forearm as well as hip but was not significantly as compared to 2020.  She would like to hold off on trying any oral bisphosphonates at this time.  I will see her back in 6 months no labs   Visit Diagnosis 1. 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 Use of exemestane (Aromasin)   3. Osteopenia of neck of left femur      Dr. Randa Evens, MD, MPH [facility] [phone] 05/13/2022 12:55 PM

## 2022-05-15 DIAGNOSIS — J343 Hypertrophy of nasal turbinates: Secondary | ICD-10-CM | POA: Diagnosis not present

## 2022-05-15 DIAGNOSIS — D3705 Neoplasm of uncertain behavior of pharynx: Secondary | ICD-10-CM | POA: Diagnosis not present

## 2022-05-23 ENCOUNTER — Ambulatory Visit
Admission: EM | Admit: 2022-05-23 | Discharge: 2022-05-23 | Disposition: A | Discharge status: Home / Self Care | Payer: Medicare HMO | Attending: Physician Assistant | Admitting: Physician Assistant

## 2022-05-23 DIAGNOSIS — I10 Essential (primary) hypertension: Secondary | ICD-10-CM | POA: Diagnosis not present

## 2022-05-23 DIAGNOSIS — J029 Acute pharyngitis, unspecified: Secondary | ICD-10-CM | POA: Diagnosis not present

## 2022-05-23 DIAGNOSIS — R051 Acute cough: Secondary | ICD-10-CM | POA: Insufficient documentation

## 2022-05-23 DIAGNOSIS — Z853 Personal history of malignant neoplasm of breast: Secondary | ICD-10-CM | POA: Diagnosis not present

## 2022-05-23 DIAGNOSIS — K580 Irritable bowel syndrome with diarrhea: Secondary | ICD-10-CM | POA: Insufficient documentation

## 2022-05-23 DIAGNOSIS — J101 Influenza due to other identified influenza virus with other respiratory manifestations: Secondary | ICD-10-CM | POA: Diagnosis not present

## 2022-05-23 DIAGNOSIS — J45909 Unspecified asthma, uncomplicated: Secondary | ICD-10-CM | POA: Insufficient documentation

## 2022-05-23 DIAGNOSIS — D649 Anemia, unspecified: Secondary | ICD-10-CM | POA: Diagnosis not present

## 2022-05-23 DIAGNOSIS — Z1152 Encounter for screening for COVID-19: Secondary | ICD-10-CM | POA: Diagnosis not present

## 2022-05-23 LAB — RESP PANEL BY RT-PCR (RSV, FLU A&B, COVID)  RVPGX2
Influenza A by PCR: POSITIVE — AB
Influenza B by PCR: NEGATIVE
Resp Syncytial Virus by PCR: NEGATIVE
SARS Coronavirus 2 by RT PCR: NEGATIVE

## 2022-05-23 LAB — GROUP A STREP BY PCR: Group A Strep by PCR: NOT DETECTED

## 2022-05-23 MED ORDER — CHERATUSSIN AC 100-10 MG/5ML PO SOLN: 5.0000 mL | Freq: Three times a day (TID) | ORAL | 0 refills | Status: DC | PRN

## 2022-05-23 MED ORDER — OSELTAMIVIR PHOSPHATE 75 MG PO CAPS: 75.0000 mg | ORAL_CAPSULE | Freq: Two times a day (BID) | ORAL | 0 refills | Status: AC

## 2022-05-23 MED ORDER — LIDOCAINE VISCOUS HCL 2 % MT SOLN: 15.0000 mL | OROMUCOSAL | 0 refills | Status: DC | PRN

## 2022-05-23 NOTE — Discharge Instructions (Addendum)
-  You have the flu.  I sent Tamiflu, cough medicine and viscous lidocaine to the pharmacy. - Increase rest and fluids. - Try to stay home for the next few days especially if you have a fever. - Need to be seen again if you not feeling better after couple weeks or you have an uncontrolled fever, weakness or breathing difficulty.

## 2022-05-23 NOTE — ED Triage Notes (Signed)
Pt c/o sore throat, upper jaw pain, cough, x3days  Pt took a home covid test and it was negative.

## 2022-05-23 NOTE — ED Provider Notes (Signed)
[facility]    CSN: [id number] Arrival date & time: 05/23/22  1000      History   Chief Complaint Chief Complaint  Patient presents with   Sore Throat   Cough    HPI Lauren Riley is a 82 y.o. female presenting for 2 day history of sore throat, sinus pressure, nasal congestion, cough, and fatigue. Denies fever, headaches, weakness, wheezing/shortness of breath, abdominal pain, nausea/vomiting or diarrhea.  Denies sick contacts.  No known exposure to flu, COVID or strep.  Taking OTC medicines for symptoms.  No other complaints or concerns.  Patient has history of asthma, breast cancer, anemia, hypertension, allergies, GERD.  HPI  Past Medical History:  Diagnosis Date   Anemia 2015   has had iron infusions   Asthma    Breast cancer ([place]) 01/2019   unsure if benign or malignant at this point in time   Cancer [facility])    skin ca   Chronic diarrhea    Dyspnea    with exercise   Dysrhythmia    GERD (gastroesophageal reflux disease)    Hypertension    IBS (irritable bowel syndrome)    Macular degeneration     Patient Active Problem List   Diagnosis Date Noted   Goals of care, counseling/discussion 02/04/2019   Ductal carcinoma in situ (DCIS) of right breast 02/04/2019   GERD (gastroesophageal reflux disease) 02/03/2019   Hypertension 02/03/2019   Heart murmur 01/07/2018   Heart palpitations 01/07/2018   Left hand pain 10/22/2017   Radial styloid tenosynovitis (de quervain) 10/22/2017   Chronic pain of right wrist 01/22/2017   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 01/22/2017   Migraine without aura and without status migrainosus, not intractable 01/16/2017   Chronic seasonal allergic rhinitis 03/06/2016   Gastroesophageal reflux disease with esophagitis 03/06/2016   Hypertension, well controlled 03/06/2016   Claustrophobia 12/30/2014   Diverticulitis of large intestine without perforation or abscess without bleeding 11/18/2014    Hypertriglyceridemia 09/15/2014   Need for vaccination with 13-polyvalent pneumococcal conjugate vaccine 09/15/2014   Rash 09/15/2014   Obesity (BMI 35.0-39.9 without comorbidity) 02/23/2014   Anxiety 12/30/2013   Irritable bowel syndrome with diarrhea 12/30/2013   Macular degeneration disease 06/27/2013   Vitamin D deficiency 06/27/2013   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    [place]   diverticulitis that had ruptured   COLOSTOMY CLOSURE  1995   EYE SURGERY Bilateral    cataract extractions   FRACTURE SURGERY     femur, ulna and radius. metal removed   JOINT REPLACEMENT Bilateral 2013, 2015   bilateral knee   PARTIAL MASTECTOMY WITH NEEDLE LOCALIZATION Right 01/26/2019   Procedure: PARTIAL MASTECTOMY WITH NEEDLE LOCALIZATION;  Surgeon: Herbert Pun, MD;  Location: [facility] ORS;  Service: General;  Laterality: Right;   REPLACEMENT TOTAL KNEE Bilateral    TONSILLECTOMY      OB History   No obstetric history on file.      Home Medications    Prior to Admission medications   Medication Sig Start Date End Date Taking? Authorizing Provider  Acetaminophen (TYLENOL 8  HOUR ARTHRITIS PAIN PO) Take 2 tablets by mouth in the morning and at bedtime.   Yes [provider]  calcium carbonate (OS-CAL) 600 MG TABS tablet Take 600 mg by mouth daily with breakfast.    Yes [provider]  Cholecalciferol (VITAMIN D3) 25 MCG (1000 UT) CAPS Take 1 capsule by mouth daily.   Yes [provider]  diphenoxylate-atropine (LOMOTIL) 2.5-0.025 MG tablet Take by mouth. 07/19/21  Yes [provider]  exemestane  (AROMASIN) 25 MG tablet Take 1 tablet (25 mg total) by mouth daily after breakfast. 05/13/22  Yes Sindy Guadeloupe, MD  guaiFENesin-codeine (CHERATUSSIN AC) 100-10 MG/5ML syrup Take 5 mLs by mouth 3 (three) times daily as needed for congestion or cough. 05/23/22  Yes Laurene Footman B, PA-C  lidocaine (XYLOCAINE) 2 % solution Use as directed 15 mLs in the mouth or throat every 3 (three) hours as needed for mouth pain (swish and spit). 05/23/22  Yes Danton Clap, PA-C  lipase/protease/amylase (CREON) 36000 UNITS CPEP capsule Take by mouth. 08/28/21 08/28/22 Yes [provider]  loperamide (IMODIUM A-D) 2 MG tablet Take 2 mg by mouth 3 (three) times daily as needed for diarrhea or loose stools.    Yes [provider]  Melatonin 10 MG TABS Take by mouth.   Yes [provider]  Multiple Vitamins-Minerals (PRESERVISION AREDS 2) CAPS Take 1 capsule by mouth 2 (two) times daily.    Yes [provider]  oseltamivir (TAMIFLU) 75 MG capsule Take 1 capsule (75 mg total) by mouth every 12 (twelve) hours for 5 days. 05/23/22 05/28/22 Yes Danton Clap, PA-C  cholestyramine Lucrezia Starch) 4 g packet Take by mouth. 11/12/20 05/13/22  [provider]  metoprolol succinate (TOPROL-XL) 100 MG 24 hr tablet Take 100 mg by mouth daily.  09/07/15 05/13/22  [provider]  pantoprazole (PROTONIX) 40 MG tablet Take 40 mg by mouth daily.  09/07/15 05/13/22  [provider]  SUMAtriptan (IMITREX) 50 MG tablet Take by mouth. 09/07/15 05/13/22  [provider]    Family History Family History  Problem Relation Age of Onset   Breast cancer Sister 44   Breast cancer Maternal Aunt        mat aunt   Parkinson's disease Mother    Congestive Heart Failure Father    Hypertension Father     Social History Social History   Tobacco Use   Smoking status: Never   Smokeless tobacco: Never  Vaping Use   Vaping Use: Never used  Substance Use Topics   Alcohol use: Never   Drug  use: Never     Allergies   Nsaids, Duloxetine, Fenofibrate, Fish oil, Niacin, Pravastatin, and Tape   Review of Systems Review of Systems  Constitutional:  Positive for fatigue. Negative for chills, diaphoresis and fever.  HENT:  Positive for congestion, rhinorrhea, sinus pressure, sore throat and voice change. Negative for ear pain and sinus pain.   Respiratory:  Positive for cough. Negative for shortness of breath.   Cardiovascular:  Negative for chest pain.  Gastrointestinal:  Negative for abdominal pain, nausea and vomiting.  Musculoskeletal:  Negative for arthralgias and myalgias.  Skin:  Negative for rash.  Neurological:  Positive for headaches. Negative for weakness.  Hematological:  Negative for adenopathy.     Physical Exam Triage Vital Signs ED Triage Vitals  Enc Vitals Group     BP      Pulse      Resp  Temp      Temp src      SpO2      Weight      Height      Head Circumference      Peak Flow      Pain Score      Pain Loc      Pain Edu?      Excl. in [place]?    No data found.  Updated Vital Signs BP (!) 147/84 (BP Location: Left Arm)   Pulse 100   Temp 98.8 F (37.1 C) (Oral)   Ht 5' 2"$  (1.575 m)   Wt 190 lb (86.2 kg)   LMP 12/01/2016   SpO2 95%   BMI 34.75 kg/m      Physical Exam Vitals and nursing note reviewed.  Constitutional:      General: She is not in acute distress.    Appearance: Normal appearance. She is not ill-appearing or toxic-appearing.  HENT:     Head: Normocephalic and atraumatic.     Nose: Congestion present.     Mouth/Throat:     Mouth: Mucous membranes are moist.     Pharynx: Oropharynx is clear. Posterior oropharyngeal erythema present.  Eyes:     General: No scleral icterus.       Right eye: No discharge.        Left eye: No discharge.     Conjunctiva/sclera: Conjunctivae normal.  Cardiovascular:     Rate and Rhythm: Normal rate and regular rhythm.     Heart sounds: Normal heart sounds.  Pulmonary:      Effort: Pulmonary effort is normal. No respiratory distress.     Breath sounds: Normal breath sounds.  Musculoskeletal:     Cervical back: Neck supple.  Skin:    General: Skin is dry.  Neurological:     General: No focal deficit present.     Mental Status: She is alert. Mental status is at baseline.     Motor: No weakness.     Gait: Gait normal.  Psychiatric:        Mood and Affect: Mood normal.        Behavior: Behavior normal.        Thought Content: Thought content normal.      UC Treatments / Results  Labs (all labs ordered are listed, but only abnormal results are displayed) Labs Reviewed  RESP PANEL BY RT-PCR (RSV, FLU A&B, COVID)  RVPGX2 - Abnormal; Notable for the following components:      Result Value   Influenza A by PCR POSITIVE (*)    All other components within normal limits  GROUP A STREP BY PCR    EKG   Radiology No results found.  Procedures Procedures (including critical care time)  Medications Ordered in UC Medications - No data to display  Initial Impression / Assessment and Plan / UC Course  I have reviewed the triage vital signs and the nursing notes.  Pertinent labs & imaging results that were available during my care of the patient were reviewed by me and considered in my medical decision making (see chart for details).   82 year old female presents for 2-day history of fatigue, cough, congestion and sore throat.  She is afebrile and overall well-appearing.  No acute distress.  On exam she has nasal congestion and mild posterior frontal erythema.  Chest clear to auscultation and heart regular rate and rhythm.  Respiratory panel and strep test obtained.  Positive influenza A testing.  Negative  COVID and strep testing.  Discussed all results with patient.  Treating patient with Tamiflu if she is within the 48-hour window.  Also sent Cheratussin.  Patient reports that she has tried other cough medications and they have not helped.  Agreed to  provide her with a small amount of Cheratussin.  Also sent viscous lidocaine.  Reviewed controlled substance database.  Encouraged rest, fluids.  Reviewed return and ED precautions.   Final Clinical Impressions(s) / UC Diagnoses   Final diagnoses:  Influenza A  Acute cough  Sore throat     Discharge Instructions      -You have the flu.  I sent Tamiflu, cough medicine and viscous lidocaine to the pharmacy. - Increase rest and fluids. - Try to stay home for the next few days especially if you have a fever. - Need to be seen again if you not feeling better after couple weeks or you have an uncontrolled fever, weakness or breathing difficulty.     ED Prescriptions     Medication Sig Dispense Auth. Provider   guaiFENesin-codeine (CHERATUSSIN AC) 100-10 MG/5ML syrup Take 5 mLs by mouth 3 (three) times daily as needed for congestion or cough. 70 mL Laurene Footman B, PA-C   oseltamivir (TAMIFLU) 75 MG capsule Take 1 capsule (75 mg total) by mouth every 12 (twelve) hours for 5 days. 10 capsule Laurene Footman B, PA-C   lidocaine (XYLOCAINE) 2 % solution Use as directed 15 mLs in the mouth or throat every 3 (three) hours as needed for mouth pain (swish and spit). 100 mL Danton Clap, PA-C      I have reviewed the PDMP during this encounter.   Danton Clap, PA-C 05/23/22 1137

## 2022-05-26 ENCOUNTER — Telehealth: Payer: Self-pay | Admitting: *Deleted

## 2022-05-26 NOTE — Telephone Encounter (Addendum)
Patient called stating that she got new insurance at beginning of year and that her Exemestane price went way up and that the company states they contacted [place] about getting it cheaper, but have not heard back from [place]. I called [employer] and spoke with a rep who said that she does not know if they called or sent a fax, but that we need to call the Coverage Determination Team TODAY at [phone] or she cannot get the tier exception for a cheaper price.   I called [facility] and spoke to [place] and she sent me to pharmacy and I spoke to [employer]. And I told her that she has tried the  arimidex and had side effects in nov. 2020. Dr. Janese Banks cahnged her to aromasin-exemestane. She states that it is brought [place] to [place] [address]. That is the cheapest price of the levels. So the med should be cheaper on tier 1 per [place]. I then called pt and told her he above also

## 2022-06-11 ENCOUNTER — Encounter: Payer: Self-pay | Admitting: Otolaryngology

## 2022-06-11 ENCOUNTER — Encounter: Payer: Self-pay | Admitting: Anesthesiology

## 2022-06-13 ENCOUNTER — Ambulatory Visit: Payer: Medicare HMO

## 2022-06-18 DIAGNOSIS — R197 Diarrhea, unspecified: Secondary | ICD-10-CM | POA: Diagnosis not present

## 2022-06-19 ENCOUNTER — Ambulatory Visit: Admission: RE | Admit: 2022-06-19 | Payer: Medicare HMO | Source: Ambulatory Visit | Admitting: Otolaryngology

## 2022-06-19 HISTORY — DX: Presence of dental prosthetic device (complete) (partial): Z97.2

## 2022-06-19 SURGERY — EXCISION, MASS, NOSE
Anesthesia: General | Laterality: Right

## 2022-07-29 DIAGNOSIS — D3705 Neoplasm of uncertain behavior of pharynx: Secondary | ICD-10-CM | POA: Diagnosis not present

## 2022-07-29 DIAGNOSIS — J343 Hypertrophy of nasal turbinates: Secondary | ICD-10-CM | POA: Diagnosis not present

## 2022-07-30 ENCOUNTER — Other Ambulatory Visit: Payer: Self-pay

## 2022-07-30 ENCOUNTER — Encounter: Payer: Self-pay | Admitting: Otolaryngology

## 2022-07-30 NOTE — Anesthesia Preprocedure Evaluation (Addendum)
Anesthesia Evaluation  Patient identified by MRN, date of birth, ID band Patient awake    Reviewed: Allergy & Precautions, H&P , NPO status , Patient's Chart, lab work & pertinent test results  Airway Mallampati: III  TM Distance: <3 FB Neck ROM: Full    Dental  (+) Partial Upper   Pulmonary neg pulmonary ROS, shortness of breath, asthma    Pulmonary exam normal breath sounds clear to auscultation       Cardiovascular hypertension, Normal cardiovascular exam+ dysrhythmias + Valvular Problems/Murmurs  Rhythm:Regular Rate:Normal     Neuro/Psych  Headaches  Anxiety     negative neurological ROS  negative psych ROS   GI/Hepatic negative GI ROS, Neg liver ROS,GERD  ,,  Endo/Other  negative endocrine ROS    Renal/GU negative Renal ROS  negative genitourinary   Musculoskeletal negative musculoskeletal ROS (+) Arthritis ,    Abdominal   Peds negative pediatric ROS (+)  Hematology negative hematology ROS (+) Blood dyscrasia, anemia   Anesthesia Other Findings Hypertension  Chronic diarrhea Dysrhythmia  Asthma Dyspnea  GERD (gastroesophageal reflux disease) Anemia  IBS (irritable bowel syndrome) Macular degeneration    Cancer Breast cancer  Wears dentures    Reproductive/Obstetrics negative OB ROS                             Anesthesia Physical Anesthesia Plan  ASA: 3  Anesthesia Plan: General   Post-op Pain Management:    Induction: Intravenous  PONV Risk Score and Plan:   Airway Management Planned: Natural Airway and Nasal Cannula  Additional Equipment:   Intra-op Plan:   Post-operative Plan:   Informed Consent: I have reviewed the patients History and Physical, chart, labs and discussed the procedure including the risks, benefits and alternatives for the proposed anesthesia with the patient or authorized representative who has indicated his/her understanding and  acceptance.     Dental Advisory Given  Plan Discussed with: Anesthesiologist, CRNA and Surgeon  Anesthesia Plan Comments: (Patient consented for risks of anesthesia including but not limited to:  - adverse reactions to medications - risk of airway placement if required - damage to eyes, teeth, lips or other oral mucosa - nerve damage due to positioning  - sore throat or hoarseness - Damage to heart, brain, nerves, lungs, other parts of body or loss of life  Patient voiced understanding.)       Anesthesia Quick Evaluation

## 2022-08-07 ENCOUNTER — Ambulatory Visit: Payer: Medicare HMO | Admitting: Anesthesiology

## 2022-08-07 ENCOUNTER — Other Ambulatory Visit: Payer: Self-pay

## 2022-08-07 ENCOUNTER — Encounter: Payer: Self-pay | Admitting: Otolaryngology

## 2022-08-07 ENCOUNTER — Encounter
Admission: RE | Disposition: A | Discharge status: Home / Self Care | Payer: Self-pay | Source: Home / Self Care | Attending: Otolaryngology

## 2022-08-07 ENCOUNTER — Ambulatory Visit
Admission: RE | Admit: 2022-08-07 | Discharge: 2022-08-07 | Disposition: A | Discharge status: Home / Self Care | Payer: Medicare HMO | Attending: Otolaryngology | Admitting: Otolaryngology

## 2022-08-07 DIAGNOSIS — D3705 Neoplasm of uncertain behavior of pharynx: Secondary | ICD-10-CM | POA: Diagnosis not present

## 2022-08-07 DIAGNOSIS — D140 Benign neoplasm of middle ear, nasal cavity and accessory sinuses: Secondary | ICD-10-CM | POA: Diagnosis not present

## 2022-08-07 DIAGNOSIS — J343 Hypertrophy of nasal turbinates: Secondary | ICD-10-CM | POA: Insufficient documentation

## 2022-08-07 DIAGNOSIS — I499 Cardiac arrhythmia, unspecified: Secondary | ICD-10-CM | POA: Diagnosis not present

## 2022-08-07 HISTORY — PX: EXCISION NASAL MASS: SHX6271

## 2022-08-07 HISTORY — PX: NASOPHARYNGOSCOPY: SHX5210

## 2022-08-07 HISTORY — PX: NASAL TURBINATE REDUCTION: SHX2072

## 2022-08-07 HISTORY — DX: Failed or difficult intubation, initial encounter: T88.4XXA

## 2022-08-07 SURGERY — NASOPHARYNGOSCOPY
Anesthesia: General | Site: Nose | Laterality: Right

## 2022-08-07 MED ORDER — HYDROCODONE-ACETAMINOPHEN 5-325 MG PO TABS: 1.0000 | ORAL_TABLET | Freq: Four times a day (QID) | ORAL | 0 refills | Status: AC | PRN

## 2022-08-07 MED ORDER — ACETAMINOPHEN 10 MG/ML IV SOLN
1000.0000 mg | Freq: Once | INTRAVENOUS | Status: AC
  Administered 2022-08-07: 1000 mg via INTRAVENOUS

## 2022-08-07 MED ORDER — LACTATED RINGERS IV SOLN: INTRAVENOUS | Status: DC

## 2022-08-07 MED ORDER — ONDANSETRON HCL 4 MG/2ML IJ SOLN
INTRAMUSCULAR | Status: DC | PRN
  Administered 2022-08-07: 4 mg via INTRAVENOUS

## 2022-08-07 MED ORDER — SUCCINYLCHOLINE CHLORIDE 200 MG/10ML IV SOSY
PREFILLED_SYRINGE | INTRAVENOUS | Status: DC | PRN
  Administered 2022-08-07: 80 mg via INTRAVENOUS

## 2022-08-07 MED ORDER — OXYCODONE HCL 5 MG PO TABS: 10.0000 mg | ORAL_TABLET | Freq: Once | ORAL | Status: DC

## 2022-08-07 MED ORDER — SUGAMMADEX SODIUM 200 MG/2ML IV SOLN
INTRAVENOUS | Status: DC | PRN
  Administered 2022-08-07: 172 mg via INTRAVENOUS

## 2022-08-07 MED ORDER — FENTANYL CITRATE (PF) 100 MCG/2ML IJ SOLN
INTRAMUSCULAR | Status: DC | PRN
  Administered 2022-08-07 (×2): 50 ug via INTRAVENOUS

## 2022-08-07 MED ORDER — DEXAMETHASONE SODIUM PHOSPHATE 4 MG/ML IJ SOLN
INTRAMUSCULAR | Status: DC | PRN
  Administered 2022-08-07: 8 mg via INTRAVENOUS

## 2022-08-07 MED ORDER — ROCURONIUM BROMIDE 100 MG/10ML IV SOLN
INTRAVENOUS | Status: DC | PRN
  Administered 2022-08-07: 30 mg via INTRAVENOUS

## 2022-08-07 MED ORDER — PROPOFOL 10 MG/ML IV BOLUS
INTRAVENOUS | Status: DC | PRN
  Administered 2022-08-07: 100 mg via INTRAVENOUS

## 2022-08-07 MED ORDER — LIDOCAINE-EPINEPHRINE 1 %-1:100000 IJ SOLN
INTRAMUSCULAR | Status: DC | PRN
  Administered 2022-08-07: 1 mL

## 2022-08-07 MED ORDER — LIDOCAINE HCL (CARDIAC) PF 100 MG/5ML IV SOSY
PREFILLED_SYRINGE | INTRAVENOUS | Status: DC | PRN
  Administered 2022-08-07: 80 mg via INTRAVENOUS

## 2022-08-07 MED ORDER — PHENYLEPHRINE HCL (PRESSORS) 10 MG/ML IV SOLN
INTRAVENOUS | Status: DC | PRN
  Administered 2022-08-07: 120 ug via INTRAVENOUS
  Administered 2022-08-07: 80 ug via INTRAVENOUS

## 2022-08-07 MED ORDER — CEPHALEXIN 500 MG PO CAPS: 500.0000 mg | ORAL_CAPSULE | Freq: Two times a day (BID) | ORAL | 0 refills | Status: AC

## 2022-08-07 MED ORDER — EPHEDRINE SULFATE (PRESSORS) 50 MG/ML IJ SOLN
INTRAMUSCULAR | Status: DC | PRN
  Administered 2022-08-07 (×2): 10 mg via INTRAVENOUS

## 2022-08-07 MED ORDER — PREDNISONE 10 MG PO TABS: ORAL_TABLET | ORAL | 0 refills | Status: AC

## 2022-08-07 MED ORDER — PHENYLEPHRINE HCL 0.5 % NA SOLN
NASAL | Status: DC | PRN
  Administered 2022-08-07: 15 mL via TOPICAL

## 2022-08-07 SURGICAL SUPPLY — 22 items
CABLE TRUDI DISPOSABLE (ENT DISPOSABLE) IMPLANT
CANISTER SUCT 1200ML W/VALVE (MISCELLANEOUS) ×3 IMPLANT
CATH IV 18X1 1/4 SAFELET (CATHETERS) IMPLANT
COAGULATOR SUCT 8FR VV (MISCELLANEOUS) IMPLANT
ELECT REM PT RETURN 9FT ADLT (ELECTROSURGICAL) ×3
ELECTRODE REM PT RTRN 9FT ADLT (ELECTROSURGICAL) ×3 IMPLANT
GLOVE SURG GAMMEX PI TX LF 7.5 (GLOVE) ×6 IMPLANT
GOWN STRL REUS W/ TWL LRG LVL3 (GOWN DISPOSABLE) ×3 IMPLANT
GOWN STRL REUS W/TWL LRG LVL3 (GOWN DISPOSABLE) ×3
IV CATH 18X1 1/4 SAFELET (CATHETERS) ×3
KIT TURNOVER KIT A (KITS) ×3 IMPLANT
NDL HYPO 27GX1-1/4 (NEEDLE) ×3 IMPLANT
NEEDLE HYPO 27GX1-1/4 (NEEDLE) ×3 IMPLANT
NS IRRIG 500ML POUR BTL (IV SOLUTION) ×3 IMPLANT
PACK ENT CUSTOM (PACKS) ×3 IMPLANT
PACKING NASAL HEMOPORE 8 (HEMOSTASIS) IMPLANT
PATTIES SURGICAL .5 X3 (DISPOSABLE) ×3 IMPLANT
STRAP BODY AND KNEE 60X3 (MISCELLANEOUS) ×3 IMPLANT
SYR 3ML LL SCALE MARK (SYRINGE) ×3 IMPLANT
TOWEL OR 17X26 4PK STRL BLUE (TOWEL DISPOSABLE) ×3 IMPLANT
TRACKER TRUDI DISPOSABLE (ENT DISPOSABLE) IMPLANT
WATER STERILE IRR 250ML POUR (IV SOLUTION) IMPLANT

## 2022-08-07 NOTE — H&P (Signed)
H&P has been reviewed and patient reevaluated, no changes necessary. To be downloaded later.  

## 2022-08-07 NOTE — Anesthesia Postprocedure Evaluation (Signed)
Anesthesia Post Note  Patient: Lauren Riley  Procedure(s) Performed: NASOPHARYNGOSCOPY (Nose) INFERIOR TURBINATE REDUCTION (Bilateral: Nose) EXCISION NASAL LESION NASOPHARYNX (Right: Nose)  Patient location during evaluation: PACU Anesthesia Type: General Level of consciousness: awake and alert Pain management: pain level controlled Vital Signs Assessment: post-procedure vital signs reviewed and stable Respiratory status: spontaneous breathing, nonlabored ventilation, respiratory function stable and patient connected to nasal cannula oxygen Cardiovascular status: blood pressure returned to baseline and stable Postop Assessment: no apparent nausea or vomiting Anesthetic complications: no   No notable events documented.   Last Vitals:  Vitals:   08/07/22 1130 08/07/22 1145  BP: (!) 155/70 (!) 148/69  Pulse: 94 84  Resp: 18 20  Temp:  36.5 C  SpO2: 91% 92%    Last Pain:  Vitals:   08/07/22 1145  TempSrc:   PainSc: 0-No pain                 [name]

## 2022-08-07 NOTE — Transfer of Care (Signed)
Immediate Anesthesia Transfer of Care Note  Patient: Lauren Riley  Procedure(s) Performed: NASOPHARYNGOSCOPY (Nose) INFERIOR TURBINATE REDUCTION (Bilateral: Nose) EXCISION NASAL LESION NASOPHARYNX (Right: Nose)  Patient Location: PACU  Anesthesia Type: General  Level of Consciousness: awake, alert  and patient cooperative  Airway and Oxygen Therapy: Patient Spontanous Breathing and Patient connected to supplemental oxygen  Post-op Assessment: Post-op Vital signs reviewed, Patient's Cardiovascular Status Stable, Respiratory Function Stable, Patent Airway and No signs of Nausea or vomiting  Post-op Vital Signs: Reviewed and stable  Complications: No notable events documented.

## 2022-08-07 NOTE — Op Note (Signed)
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  Use of image guided system.  Bilateral inferior turbinate partial reduction.  Surg:  Beverly Sessions Corry Storie  Anes:  GOT  EBL: 30 mL  Comp: None  Findings: The mass was lying against the right posterior septum and extending into the nasopharynx.  The entire mass was about 3 cm in height and 3 cm in depth and about 1 cm in width.  With the endoscope I could see that there was a small base superiorly that it was attached to and the rest of it was all hanging from that is a mass up against the septum but not attached to it posteriorly and inferiorly.  There is only 1 small stalk that was less than 1 cm in diameter at the posterior mid septum.  Procedure: The patient was brought to the operating room placed in the supine position.  She was given general anesthesia by oral endotracheal intubation.  The image guided system was brought in and her CT scan was downloaded to the system.  Her face was then registered to the CT scan.  This showed good alignment with the straight suction.  The nose was then prepped using cotton pledgets soaked in phenylephrine and Xylocaine for vasoconstriction and anesthesia.  She was prepped and draped in sterile fashion.  The 0 degree scope was used to visualize both sides of the nose.  On the left side of the nose the septum was very healthy and clear and showed no sign of any lesions.  There was nothing attached at the posterior septum.  We could see the tumor extending into the nasopharynx on the right from the left nostril.  The scope was then put through the right nostril and you could see the multilobulated mass of tumor that was attached to the posterior septum.  It  had some areas of thick mucus within the mass that seem to come out.  This was pasty like yellowish mucus but nothing appeared to be inflamed.  I used a Glorious Peach to move around a little bit to see and was surprised to find that the entire lower portion of it was not attached to the septum but just hanging free with a single stalk that was attached to the septum in the mid mid posterior septum on the right.  The stalk was a centimeter round or less and all the rest of the 3 cm sized mass was just hanging from this 1 stock.  I used about 1 mL of local anesthesia to inject here.  This was 1% Xylocaine with epi 1: 100,000 for infiltration of the septum.  The inferior turbinates were enlarged so they were trimmed slightly on both sides with some of the lower portion of the turbinate.  The remaining turbinate was outfractured to provide more room work to work in the airway on both sides.  This opened up her airway significantly bilaterally.  Using the 0 degree scope to visualize the area a sickle knife was used to incise the septal mucosa and a half circle around the anterior border of the stalk.  Nasal scissors were then used to cut the mucosa at the superior and inferior borders and the more posterior border of the stalk to remove this.  A freer elevator was used to separate it from the underlying bone.  The entire piece was then freed.  This was grasped with a large Blakesley forceps and was gently pulled out.  The stalk tore off of the mass and was separate as a specimen that has the base that I removed from the stalk and a small section of scar.  Then with the scope I went back and could grab the entire mass and was able to slide out through the nose.  This was about 3 x 3 cm but was smooth lined all the way around and papillomata's looking.  It had some areas of mucus in it.  There was 1 small tear where it was attached to the stock but the rest of it had an intact mucosa that was just hanging.  This was all sent  for permanent section.  The area was revisualized and cleaned up and had a little bit of ooze from the mucosa here.  I use suction electrocautery and did a small amount of cautery around the excision site of the mucosa to make sure the bleeding was fairly well stopped.  I then used a small piece of Hemapore to push into this area and help with tamponade and controlling any bleed that might be there.  This is water-soluble and should wash away in the next several days.  0 degree scope was then used to visualize both sides of the nose again.  The airway was open and clear and there was no sign of any bleeding in the right posterior nose.  The entire mass was removed and the rest of her anatomy looks very healthy and clear.    Dispo:   To PACU to be discharged home  Plan: To follow-up in the office in 5 days for reevaluation of the area and make sure it is healing well.  She will start saline flushes next 48 hours and help wash the nose clean.  She will rest with her head elevated.  I have given her prescription for some Tylenol with hydrocodone if needed for severe pain.  She is also given short prednisone taper and some antibiotics for prevention.  She can call the office if she has problems.    Beverly Sessions Madilyne Tadlock  08/07/2022 11:21 AM

## 2022-08-11 LAB — SURGICAL PATHOLOGY

## 2022-08-13 ENCOUNTER — Encounter: Payer: Self-pay | Admitting: Otolaryngology

## 2022-08-13 DIAGNOSIS — D3705 Neoplasm of uncertain behavior of pharynx: Secondary | ICD-10-CM | POA: Diagnosis not present

## 2022-09-03 DIAGNOSIS — F419 Anxiety disorder, unspecified: Secondary | ICD-10-CM | POA: Diagnosis not present

## 2022-09-03 DIAGNOSIS — R197 Diarrhea, unspecified: Secondary | ICD-10-CM | POA: Diagnosis not present

## 2022-09-03 DIAGNOSIS — R1032 Left lower quadrant pain: Secondary | ICD-10-CM | POA: Diagnosis not present

## 2022-09-03 DIAGNOSIS — K580 Irritable bowel syndrome with diarrhea: Secondary | ICD-10-CM | POA: Diagnosis not present

## 2022-09-03 DIAGNOSIS — R1031 Right lower quadrant pain: Secondary | ICD-10-CM | POA: Diagnosis not present

## 2022-09-03 DIAGNOSIS — K8681 Exocrine pancreatic insufficiency: Secondary | ICD-10-CM | POA: Diagnosis not present

## 2022-09-15 ENCOUNTER — Other Ambulatory Visit: Payer: Self-pay | Admitting: Gastroenterology

## 2022-09-15 DIAGNOSIS — R197 Diarrhea, unspecified: Secondary | ICD-10-CM

## 2022-09-15 DIAGNOSIS — R1031 Right lower quadrant pain: Secondary | ICD-10-CM

## 2022-09-16 DIAGNOSIS — D140 Benign neoplasm of middle ear, nasal cavity and accessory sinuses: Secondary | ICD-10-CM | POA: Diagnosis not present

## 2022-09-26 ENCOUNTER — Ambulatory Visit
Admission: RE | Admit: 2022-09-26 | Discharge: 2022-09-26 | Disposition: A | Discharge status: Home / Self Care | Payer: Medicare HMO | Source: Ambulatory Visit | Attending: Gastroenterology | Admitting: Gastroenterology

## 2022-09-26 DIAGNOSIS — R1032 Left lower quadrant pain: Secondary | ICD-10-CM

## 2022-09-26 DIAGNOSIS — R1031 Right lower quadrant pain: Secondary | ICD-10-CM | POA: Diagnosis not present

## 2022-09-26 DIAGNOSIS — R197 Diarrhea, unspecified: Secondary | ICD-10-CM | POA: Insufficient documentation

## 2022-09-26 DIAGNOSIS — K5730 Diverticulosis of large intestine without perforation or abscess without bleeding: Secondary | ICD-10-CM | POA: Diagnosis not present

## 2022-09-26 DIAGNOSIS — N200 Calculus of kidney: Secondary | ICD-10-CM | POA: Diagnosis not present

## 2022-09-29 DIAGNOSIS — H353134 Nonexudative age-related macular degeneration, bilateral, advanced atrophic with subfoveal involvement: Secondary | ICD-10-CM | POA: Diagnosis not present

## 2022-10-14 DIAGNOSIS — L821 Other seborrheic keratosis: Secondary | ICD-10-CM | POA: Diagnosis not present

## 2022-10-14 DIAGNOSIS — L820 Inflamed seborrheic keratosis: Secondary | ICD-10-CM | POA: Diagnosis not present

## 2022-10-14 DIAGNOSIS — D225 Melanocytic nevi of trunk: Secondary | ICD-10-CM | POA: Diagnosis not present

## 2022-10-14 DIAGNOSIS — R233 Spontaneous ecchymoses: Secondary | ICD-10-CM | POA: Diagnosis not present

## 2022-10-20 DIAGNOSIS — Z1331 Encounter for screening for depression: Secondary | ICD-10-CM | POA: Diagnosis not present

## 2022-10-20 DIAGNOSIS — Z1330 Encounter for screening examination for mental health and behavioral disorders, unspecified: Secondary | ICD-10-CM | POA: Diagnosis not present

## 2022-10-20 DIAGNOSIS — Z0000 Encounter for general adult medical examination without abnormal findings: Secondary | ICD-10-CM | POA: Diagnosis not present

## 2022-10-21 DIAGNOSIS — Z789 Other specified health status: Secondary | ICD-10-CM | POA: Diagnosis not present

## 2022-10-21 DIAGNOSIS — H3530 Unspecified macular degeneration: Secondary | ICD-10-CM | POA: Diagnosis not present

## 2022-10-21 DIAGNOSIS — H60541 Acute eczematoid otitis externa, right ear: Secondary | ICD-10-CM | POA: Diagnosis not present

## 2022-10-21 DIAGNOSIS — F419 Anxiety disorder, unspecified: Secondary | ICD-10-CM | POA: Diagnosis not present

## 2022-10-21 DIAGNOSIS — N1831 Chronic kidney disease, stage 3a: Secondary | ICD-10-CM | POA: Diagnosis not present

## 2022-10-21 DIAGNOSIS — I10 Essential (primary) hypertension: Secondary | ICD-10-CM | POA: Diagnosis not present

## 2022-10-21 DIAGNOSIS — E781 Pure hyperglyceridemia: Secondary | ICD-10-CM | POA: Diagnosis not present

## 2022-10-21 DIAGNOSIS — K580 Irritable bowel syndrome with diarrhea: Secondary | ICD-10-CM | POA: Diagnosis not present

## 2022-10-21 DIAGNOSIS — I129 Hypertensive chronic kidney disease with stage 1 through stage 4 chronic kidney disease, or unspecified chronic kidney disease: Secondary | ICD-10-CM | POA: Diagnosis not present

## 2022-10-21 DIAGNOSIS — K2100 Gastro-esophageal reflux disease with esophagitis, without bleeding: Secondary | ICD-10-CM | POA: Diagnosis not present

## 2022-11-03 DIAGNOSIS — K219 Gastro-esophageal reflux disease without esophagitis: Secondary | ICD-10-CM | POA: Diagnosis not present

## 2022-11-03 DIAGNOSIS — K580 Irritable bowel syndrome with diarrhea: Secondary | ICD-10-CM | POA: Diagnosis not present

## 2022-11-11 ENCOUNTER — Encounter: Payer: Self-pay | Admitting: Oncology

## 2022-11-11 ENCOUNTER — Inpatient Hospital Stay: Payer: Medicare HMO | Attending: Oncology | Admitting: Oncology

## 2022-11-11 DIAGNOSIS — Z86000 Personal history of in-situ neoplasm of breast: Secondary | ICD-10-CM

## 2022-11-11 DIAGNOSIS — Z79811 Long term (current) use of aromatase inhibitors: Secondary | ICD-10-CM

## 2022-11-11 DIAGNOSIS — Z08 Encounter for follow-up examination after completed treatment for malignant neoplasm: Secondary | ICD-10-CM

## 2022-11-11 NOTE — Progress Notes (Signed)
I connected with Lauren Riley on 11/11/22 at  3:00 PM EDT by video enabled telemedicine visit and verified that I am speaking with the correct person using two identifiers.   I discussed the limitations, risks, security and privacy concerns of performing an evaluation and management service by telemedicine and the availability of in-person appointments. I also discussed with the patient that there may be a patient responsible charge related to this service. The patient expressed understanding and agreed to proceed.  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  She was seen by Dr. 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  No evidence of invasive carcinoma.  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   Patient's case was discussed at tumor board and radiation therapy adjuvantly was not deemed to be necessary.  She was started on Arimidex in November 2020 which she did not tolerate due to worsening joint pain and back pain and was switched to Aromasin  Interval history patient does have mild arthralgias ever since she has been on Aromasin which is overall self-limited and managed with as needed Tylenol.  She has had on and off diarrhea for many years now which has worsened in the last few months and she is wondering if it is related to exemestane.   Review of  Systems  Constitutional:  Negative for chills, fever, malaise/fatigue and weight loss.  HENT:  Negative for congestion, ear discharge and nosebleeds.   Eyes:  Negative for blurred vision.  Respiratory:  Negative for cough, hemoptysis, sputum production, shortness of breath and wheezing.   Cardiovascular:  Negative for chest pain, palpitations, orthopnea and claudication.  Gastrointestinal:  Positive for diarrhea. Negative for abdominal pain, blood in stool, constipation, heartburn, melena, nausea and vomiting.  Genitourinary:  Negative for dysuria, flank pain, frequency, hematuria and urgency.  Musculoskeletal:  Positive for joint pain. Negative for back pain and myalgias.  Skin:  Negative for rash.  Neurological:  Negative for dizziness, tingling, focal weakness, seizures, weakness and headaches.  Endo/Heme/Allergies:  Does not bruise/bleed easily.  Psychiatric/Behavioral:  Negative for depression and suicidal ideas. The patient does not have insomnia.     Allergies  Allergen Reactions   Nsaids     Other reaction(s):  GI UPSET; CHEST BURNING FROM IBUPROFEN    Duloxetine Other (See Comments)   Fenofibrate     Liver Disorder Elevated LFT's    Fish Oil Diarrhea    GI upset    Niacin     Flushing    Pravastatin Other (See Comments)    Mylagias    Tape Rash    ADHESIVE TAPE- OK W/ PAPER TAPES.     Past Medical History:  Diagnosis Date   Anemia 2015   has had iron infusions   Asthma    Breast cancer (HCC) 01/2019   unsure if benign or malignant at this point in time   Cancer [facility])    skin ca  Chronic diarrhea    Difficult intubation    Dyspnea    with exercise   Dysrhythmia    during knee replacement/ no issues now   GERD (gastroesophageal reflux disease)    Hypertension    IBS (irritable bowel syndrome)    Macular degeneration    Wears dentures    partial upper    Past Surgical History:  Procedure Laterality Date   ABDOMINAL HYSTERECTOMY     BREAST BIOPSY  Left    unsure which side and between 1974 and 1980 Negative was a cyst   BREAST BIOPSY Right 01/12/2019   intraductal papilloma   BREAST LUMPECTOMY Right 01/26/2019`   intraductal papilloma excisied.    CHOLECYSTECTOMY     COLON SURGERY  1995   diverticulitis that had ruptured   COLOSTOMY CLOSURE  1995   EXCISION NASAL MASS Right 08/07/2022   Procedure: EXCISION NASAL LESION NASOPHARYNX;  Surgeon: Vernie Murders, MD;  Location: [facility];  Service: ENT;  Laterality: Right;   EYE SURGERY Bilateral    cataract extractions   FRACTURE SURGERY     femur, ulna and radius. metal removed   JOINT REPLACEMENT Bilateral 2013, 2015   bilateral knee   NASAL TURBINATE REDUCTION Bilateral 08/07/2022   Procedure: INFERIOR TURBINATE REDUCTION;  Surgeon: Vernie Murders, MD;  Location[facility];  Service: ENT;  Laterality: Bilateral;   NASOPHARYNGOSCOPY N/A 08/07/2022   Procedure: NASOPHARYNGOSCOPY;  Surgeon: Vernie Murders, MD;  Location: [facility];  Service: ENT;  Laterality: N/A;   PARTIAL MASTECTOMY WITH NEEDLE LOCALIZATION Right 01/26/2019   Procedure: PARTIAL MASTECTOMY WITH NEEDLE LOCALIZATION;  Surgeon: Carolan Shiver, MD;  Location: [facility];  Service: General;  Laterality: Right;   REPLACEMENT TOTAL KNEE Bilateral    TONSILLECTOMY      Social History   Socioeconomic History   Marital status: Married    Spouse name: Dan   Number of children: Not on file   Years of education: Not on file   Highest education level: Not on file  Occupational History   Occupation: school teacher    Comment: retired  Tobacco Use   Smoking status: Never   Smokeless tobacco: Never  Vaping Use   Vaping status: Never Used  Substance and Sexual Activity   Alcohol use: Never   Drug use: Never   Sexual activity: Not on file  Other Topics Concern   Not on file  Social History Narrative   Recently transplanted to live near son and his family.   One daughter died in an MVA  that patient was also in.   Social Determinants of Health   Financial Resource Strain: Low Risk  (10/20/2022)   Received from [facility]   Overall Financial Resource Strain (CARDIA)    Difficulty of Paying Living Expenses: Not hard at all  Food Insecurity: No Food Insecurity (10/20/2022)   Received from [facility]   Hunger Vital Sign    Worried About Running Out of Food in the Last Year: Never true    Ran Out of Food in the Last Year: Never true  Transportation Needs: Patient Declined (10/20/2022)   Received from [facility] - Transportation    In the past 12 months, has lack of transportation kept you from medical appointments or from getting medications?: Patient declined    Lack of Transportation (Non-Medical): Patient declined  Physical Activity: Not on file  Stress: Not on file  Social Connections: Not  on file  Intimate Partner Violence: Not on file    Family History  Problem Relation Age of Onset   Breast cancer Sister 71   Breast cancer Maternal Aunt        mat aunt   Parkinson's disease Mother    Congestive Heart Failure Father    Hypertension Father      Current Outpatient Medications:    Acetaminophen (TYLENOL 8 HOUR ARTHRITIS PAIN PO), Take 2 tablets by mouth in the morning and at bedtime., Disp: , Rfl:    cholestyramine (QUESTRAN) 4 g packet, Take by mouth., Disp: , Rfl:    exemestane (AROMASIN) 25 MG tablet, Take 1 tablet (25 mg total) by mouth daily after breakfast., Disp: 90 tablet, Rfl: 3   metoprolol succinate (TOPROL-XL) 100 MG 24 hr tablet, Take 100 mg by mouth daily. , Disp: , Rfl:    pantoprazole (PROTONIX) 40 MG tablet, Take 80 mg by mouth daily., Disp: , Rfl:    cephALEXin (KEFLEX) 500 MG capsule, Take 1 capsule (500 mg total) by mouth 2 (two) times daily. (Patient not taking: Reported on 11/11/2022), Disp: 14 capsule, Rfl: 0   loperamide (IMODIUM A-D) 2 MG tablet, Take 2 mg by mouth 3  (three) times daily as needed for diarrhea or loose stools. (Patient not taking: Reported on 11/11/2022), Disp: , Rfl:    Melatonin 10 MG TABS, Take by mouth. (Patient not taking: Reported on 11/11/2022), Disp: , Rfl:    Multiple Vitamins-Minerals (PRESERVISION AREDS 2) CAPS, Take 1 capsule by mouth 2 (two) times daily.  (Patient not taking: Reported on 11/11/2022), Disp: , Rfl:    predniSONE (DELTASONE) 10 MG tablet, Start with 3 pills tomorrow. Taper over the next 6 days.  3,3,2,2,1,1. (Patient not taking: Reported on 11/11/2022), Disp: 12 tablet, Rfl: 0   SUMAtriptan (IMITREX) 50 MG tablet, Take by mouth. (Patient not taking: Reported on 11/11/2022), Disp: , Rfl:   No results found.  No images are attached to the encounter.      Latest Ref Rng & Units 01/12/2020   11:16 AM  CMP  Glucose 70 - 99 mg/dL 99   BUN 8 - 23 mg/dL 16   Creatinine 3.87 - 1.00 mg/dL 5.64   Sodium 332 - 951 mmol/L 140   Potassium 3.5 - 5.1 mmol/L 4.3   Chloride 98 - 111 mmol/L 105   CO2 22 - 32 mmol/L 27   Calcium 8.9 - 10.3 mg/dL 9.1   Total Protein 6.5 - 8.1 g/dL 6.5   Total Bilirubin 0.3 - 1.2 mg/dL 1.2   Alkaline Phos 38 - 126 U/L 60   AST 15 - 41 U/L 21   ALT 0 - 44 U/L 18       Latest Ref Rng & Units 01/12/2020   11:16 AM  CBC  WBC 4.0 - 10.5 K/uL 8.1   Hemoglobin 12.0 - 15.0 g/dL 88.4   Hematocrit 16.6 - 46.0 % 39.0   Platelets 150 - 400 K/uL 230      Observation/objective: Appears in no acute distress over video visit today.  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  I have encouraged her to keep taking it along with calcium and vitamin D.  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   She does not require any adjuvant bisphosphonates at this time and I will plan to repeat her bone density scan in April of next year.  Mammograms will be coordinated by Dr. Maia Plan in October of this year.  Her vitamin D levels were normal last year.   I have asked her to take 1200 mg of calcium along with 800 international units of vitamin D and these medication should not cause diarrhea typically  Diarrhea: Unlikely to be related to exemestane but if it is bothering her she can hold exemestane for 2 weeks and see if the diarrhea gets better.  She will let us know if that is the case.  Follow-up instructions: I will see her back in 6 months for an in person visit  I discussed the assessment and treatment plan with the patient. The patient was provided an opportunity to ask questions and all were answered. The patient agreed with the plan and demonstrated an understanding of the instructions.   The patient was advised to call back or seek an in-person evaluation if the symptoms worsen or if the condition fails to improve as anticipated.  I provided 11 minutes of face-to-face video visit time during this encounter, and > 50% was spent counseling as documented under my assessment & plan.  Visit Diagnosis: 1. 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 Use of exemestane (Aromasin)     Dr. Owens Shark, MD, MPH [facility] Tel- [phone] 11/11/2022 3:30 PM

## 2022-11-20 DIAGNOSIS — Z0100 Encounter for examination of eyes and vision without abnormal findings: Secondary | ICD-10-CM | POA: Diagnosis not present

## 2022-12-15 DIAGNOSIS — D140 Benign neoplasm of middle ear, nasal cavity and accessory sinuses: Secondary | ICD-10-CM | POA: Diagnosis not present

## 2022-12-15 DIAGNOSIS — H6123 Impacted cerumen, bilateral: Secondary | ICD-10-CM | POA: Diagnosis not present

## 2022-12-15 DIAGNOSIS — H903 Sensorineural hearing loss, bilateral: Secondary | ICD-10-CM | POA: Diagnosis not present

## 2022-12-19 ENCOUNTER — Other Ambulatory Visit: Payer: Self-pay | Admitting: Oncology

## 2023-01-05 DIAGNOSIS — H903 Sensorineural hearing loss, bilateral: Secondary | ICD-10-CM | POA: Diagnosis not present

## 2023-02-05 ENCOUNTER — Other Ambulatory Visit: Payer: Self-pay | Admitting: General Surgery

## 2023-02-05 DIAGNOSIS — Z1231 Encounter for screening mammogram for malignant neoplasm of breast: Secondary | ICD-10-CM

## 2023-02-17 DIAGNOSIS — Z961 Presence of intraocular lens: Secondary | ICD-10-CM | POA: Diagnosis not present

## 2023-02-17 DIAGNOSIS — H353134 Nonexudative age-related macular degeneration, bilateral, advanced atrophic with subfoveal involvement: Secondary | ICD-10-CM | POA: Diagnosis not present

## 2023-02-17 DIAGNOSIS — H04123 Dry eye syndrome of bilateral lacrimal glands: Secondary | ICD-10-CM | POA: Diagnosis not present

## 2023-03-03 ENCOUNTER — Ambulatory Visit
Admission: RE | Admit: 2023-03-03 | Discharge: 2023-03-03 | Disposition: A | Discharge status: Home / Self Care | Payer: Medicare HMO | Source: Ambulatory Visit | Attending: General Surgery | Admitting: General Surgery

## 2023-03-03 DIAGNOSIS — Z1231 Encounter for screening mammogram for malignant neoplasm of breast: Secondary | ICD-10-CM | POA: Diagnosis not present

## 2023-03-26 DIAGNOSIS — D0511 Intraductal carcinoma in situ of right breast: Secondary | ICD-10-CM | POA: Diagnosis not present

## 2023-04-21 ENCOUNTER — Other Ambulatory Visit: Payer: Self-pay | Admitting: Gastroenterology

## 2023-04-21 DIAGNOSIS — K219 Gastro-esophageal reflux disease without esophagitis: Secondary | ICD-10-CM

## 2023-04-29 ENCOUNTER — Ambulatory Visit: Payer: Medicare PPO

## 2023-05-04 ENCOUNTER — Telehealth: Payer: Self-pay | Admitting: Oncology

## 2023-05-04 NOTE — Telephone Encounter (Signed)
Pt called and wanted to push her Feb appt to the end of the summer (Aug). Pt stated she just saw Dr.Cintron and doesn't feel that she needs to have this appt with Dr.Rao right now. Appt has been moved to Aug.

## 2023-05-11 ENCOUNTER — Ambulatory Visit: Payer: Medicare HMO | Admitting: Oncology

## 2023-07-08 ENCOUNTER — Other Ambulatory Visit: Payer: Self-pay | Admitting: Oncology

## 2023-07-09 ENCOUNTER — Other Ambulatory Visit: Payer: Self-pay

## 2023-07-09 MED ORDER — EXEMESTANE 25 MG PO TABS: 25.0000 mg | ORAL_TABLET | Freq: Every day | ORAL | 2 refills | Status: AC

## 2023-11-11 ENCOUNTER — Ambulatory Visit: Payer: Self-pay | Admitting: Oncology

## 2023-12-14 ENCOUNTER — Ambulatory Visit: Payer: Self-pay | Admitting: Oncology

## 2023-12-15 ENCOUNTER — Encounter: Payer: Self-pay | Admitting: Oncology

## 2023-12-15 ENCOUNTER — Inpatient Hospital Stay: Payer: Self-pay | Attending: Oncology | Admitting: Oncology

## 2023-12-15 VITALS — BP 161/67 | HR 79 | Temp 96.9°F | Resp 18 | Ht 63.0 in | Wt 191.1 lb

## 2023-12-15 DIAGNOSIS — M85831 Other specified disorders of bone density and structure, right forearm: Secondary | ICD-10-CM

## 2023-12-15 DIAGNOSIS — M8580 Other specified disorders of bone density and structure, unspecified site: Secondary | ICD-10-CM | POA: Diagnosis not present

## 2023-12-15 DIAGNOSIS — Z79811 Long term (current) use of aromatase inhibitors: Secondary | ICD-10-CM | POA: Insufficient documentation

## 2023-12-15 DIAGNOSIS — D0511 Intraductal carcinoma in situ of right breast: Secondary | ICD-10-CM | POA: Diagnosis not present

## 2023-12-15 DIAGNOSIS — Z79899 Other long term (current) drug therapy: Secondary | ICD-10-CM

## 2023-12-15 DIAGNOSIS — Z803 Family history of malignant neoplasm of breast: Secondary | ICD-10-CM | POA: Diagnosis not present

## 2023-12-15 NOTE — Progress Notes (Signed)
 Hematology/Oncology Consult note [facility]  [phone] [phone]  Patient Care Team: Delfina Pao, MD as PCP - General (Pediatrics) Melanee Annah BROCKS, MD as Consulting Physician (Oncology) Rodolph Romano, MD as Consulting Physician (General Surgery) Cindie Jesusa HERO, RN as Registered Nurse   Name of the patient: Lauren Riley  [id number]  1941/02/03   Date of visit: 12/15/23  Diagnosis-history of DCIS  Chief complaint/ Reason for visit-routine follow-up of DCIS on Aromasin   Heme/Onc history:  patient is a 83 year old female who underwent a screening mammogram in September 2020 which showed a suspicious 9 mm mass in the right breast 9:30 position.  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  She was seen by Dr. 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  No evidence of invasive carcinoma.  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   Patient's case was discussed at tumor board and radiation therapy adjuvantly was not deemed to be necessary.  She was started on Arimidex  in November 2020 which she did not tolerate due to worsening joint pain and back pain and was switched to Aromasin   Interval history-she has some ongoing joint pain and hopes that it will get better once she is done with Aromasin  in 2 weeks time.  Denies any breast concerns today  ECOG PS- 1 Pain scale- 3 Opioid associated constipation- no  Review of systems- Review of Systems  Constitutional:  Negative for chills, fever, malaise/fatigue and weight loss.  HENT:  Negative for congestion, ear discharge and nosebleeds.   Eyes:  Negative for blurred vision.  Respiratory:  Negative for cough,  hemoptysis, sputum production, shortness of breath and wheezing.   Cardiovascular:  Negative for chest pain, palpitations, orthopnea and claudication.  Gastrointestinal:  Negative for abdominal pain, blood in stool, constipation, diarrhea, heartburn, melena, nausea and vomiting.  Genitourinary:  Negative for dysuria, flank pain, frequency, hematuria and urgency.  Musculoskeletal:  Positive for joint pain. Negative for back pain and myalgias.  Skin:  Negative for rash.  Neurological:  Negative for dizziness, tingling, focal weakness, seizures, weakness and headaches.  Endo/Heme/Allergies:  Does not bruise/bleed easily.  Psychiatric/Behavioral:  Negative for depression and suicidal ideas. The patient does not have insomnia.       Allergies  Allergen Reactions   Nsaids     Other reaction(s):  GI UPSET; CHEST BURNING FROM IBUPROFEN    Duloxetine Other (See Comments)   Fenofibrate     Liver Disorder Elevated LFT's    Fish Oil Diarrhea    GI upset    Niacin     Flushing    Pravastatin Other (See Comments)    Mylagias    Tape Rash    ADHESIVE TAPE- OK W/ PAPER TAPES.      Past Medical History:  Diagnosis Date   Anemia 2015   has had iron infusions   Asthma    Breast cancer (HCC) 01/2019   unsure if benign or malignant at this point in time   Cancer [facility])    skin ca   Chronic diarrhea    Difficult intubation    Dyspnea    with exercise   Dysrhythmia    during knee replacement/ no issues now   GERD (gastroesophageal reflux disease)    Hypertension    IBS (irritable  bowel syndrome)    Macular degeneration    Wears dentures    partial upper     Past Surgical History:  Procedure Laterality Date   ABDOMINAL HYSTERECTOMY     BREAST BIOPSY Left    unsure which side and between 1974 and 1980 Negative was a cyst   BREAST BIOPSY Right 01/12/2019   intraductal papilloma   BREAST LUMPECTOMY Right 01/26/2019`   intraductal papilloma excisied.    CHOLECYSTECTOMY      COLON SURGERY  1995   diverticulitis that had ruptured   COLOSTOMY CLOSURE  1995   EXCISION NASAL MASS Right 08/07/2022   Procedure: EXCISION NASAL LESION NASOPHARYNX;  Surgeon: Edda Mt, MD;  Location: [facility];  Service: ENT;  Laterality: Right;   EYE SURGERY Bilateral    cataract extractions   FRACTURE SURGERY     femur, ulna and radius. metal removed   JOINT REPLACEMENT Bilateral 2013, 2015   bilateral knee   NASAL TURBINATE REDUCTION Bilateral 08/07/2022   Procedure: INFERIOR TURBINATE REDUCTION;  Surgeon: Edda Mt, MD;  Location: [facility];  Service: ENT;  Laterality: Bilateral;   NASOPHARYNGOSCOPY N/A 08/07/2022   Procedure: NASOPHARYNGOSCOPY;  Surgeon: Edda Mt, MD;  Location: [facility];  Service: ENT;  Laterality: N/A;   PARTIAL MASTECTOMY WITH NEEDLE LOCALIZATION Right 01/26/2019   Procedure: PARTIAL MASTECTOMY WITH NEEDLE LOCALIZATION;  Surgeon: Rodolph Romano, MD;  Location: [facility];  Service: General;  Laterality: Right;   REPLACEMENT TOTAL KNEE Bilateral    TONSILLECTOMY      Social History   Socioeconomic History   Marital status: Married    Spouse name: Dan   Number of children: Not on file   Years of education: Not on file   Highest education level: Not on file  Occupational History   Occupation: school teacher    Comment: retired  Tobacco Use   Smoking status: Never   Smokeless tobacco: Never  Vaping Use   Vaping status: Never Used  Substance and Sexual Activity   Alcohol use: Never   Drug use: Never   Sexual activity: Not on file  Other Topics Concern   Not on file  Social History Narrative   Recently transplanted to live near son and his family.   One daughter died in an MVA that patient was also in.   Social Drivers of Health   Financial Resource Strain: Patient Declined (10/04/2023)   [facility] from [facility]   Overall Financial Resource Strain (CARDIA)    Difficulty of Paying  Living Expenses: Patient declined  Food Insecurity: Patient Declined (10/04/2023)   Received from [facility]   Hunger Vital Sign    Within the past 12 months, you worried that your food would run out before you got the money to buy more.: Patient declined    Within the past 12 months, the food you bought just didn't last and you didn't have money to get more.: Patient declined  Transportation Needs: Unmet Transportation Needs (10/04/2023)   Received from [facility] - Transportation    In the past 12 months, has lack of transportation kept you from medical appointments or from getting medications?: Yes    Lack of Transportation (Non-Medical): Yes  Physical Activity: Not on file  Stress: Not on file  Social Connections: Not on file  Intimate Partner Violence: Not on file    Family History  Problem Relation Age of Onset   Breast cancer  Sister 34   Breast cancer Maternal Aunt        mat aunt   Parkinson's disease Mother    Congestive Heart Failure Father    Hypertension Father      Current Outpatient Medications:    Acetaminophen  (TYLENOL  8 HOUR ARTHRITIS PAIN PO), Take 2 tablets by mouth in the morning and at bedtime., Disp: , Rfl:    Cholecalciferol 50 MCG (2000 UT) CAPS, Take by mouth., Disp: , Rfl:    cholestyramine (QUESTRAN) 4 g packet, Take by mouth., Disp: , Rfl:    exemestane  (AROMASIN ) 25 MG tablet, Take 1 tablet (25 mg total) by mouth daily after breakfast., Disp: 90 tablet, Rfl: 2   Melatonin 10 MG TABS, Take by mouth., Disp: , Rfl:    metoprolol succinate (TOPROL-XL) 100 MG 24 hr tablet, Take 100 mg by mouth daily. , Disp: , Rfl:    pantoprazole (PROTONIX) 40 MG tablet, Take 80 mg by mouth daily., Disp: , Rfl:    cephALEXin  (KEFLEX ) 500 MG capsule, Take 1 capsule (500 mg total) by mouth 2 (two) times daily. (Patient not taking: Reported on 11/11/2022), Disp: 14 capsule, Rfl: 0   loperamide (IMODIUM A-D) 2 MG tablet, Take 2 mg  by mouth 3 (three) times daily as needed for diarrhea or loose stools. (Patient not taking: Reported on 11/11/2022), Disp: , Rfl:    Multiple Vitamins-Minerals (PRESERVISION AREDS 2) CAPS, Take 1 capsule by mouth 2 (two) times daily.  (Patient not taking: Reported on 11/11/2022), Disp: , Rfl:    predniSONE  (DELTASONE ) 10 MG tablet, Start with 3 pills tomorrow. Taper over the next 6 days.  3,3,2,2,1,1. (Patient not taking: Reported on 11/11/2022), Disp: 12 tablet, Rfl: 0   SUMAtriptan (IMITREX) 50 MG tablet, Take by mouth. (Patient not taking: Reported on 11/11/2022), Disp: , Rfl:   Physical exam:  Vitals:   12/15/23 1424  BP: (!) 161/67  Pulse: 79  Resp: 18  Temp: (!) 96.9 F (36.1 C)  TempSrc: Tympanic  SpO2: 100%  Weight: 191 lb 1.6 oz (86.7 kg)  Height: 5' 3 (1.6 m)   Physical Exam Cardiovascular:     Rate and Rhythm: Normal rate and regular rhythm.     Heart sounds: Normal heart sounds.  Pulmonary:     Effort: Pulmonary effort is normal.     Breath sounds: Normal breath sounds.  Skin:    General: Skin is warm and dry.  Neurological:     Mental Status: She is alert and oriented to person, place, and time.    Breast exam was performed in seated and lying down position. Patient is status post right lumpectomy with a well-healed surgical scar. No evidence of any palpable masses. No evidence of axillary adenopathy. No evidence of any palpable masses or lumps in the left breast. No evidence of leftt axillary adenopathy   I have personally reviewed labs listed below:    Latest Ref Rng & Units 01/12/2020   11:16 AM  CMP  Glucose 70 - 99 mg/dL 99   BUN 8 - 23 mg/dL 16   Creatinine 9.55 - 1.00 mg/dL 9.07   Sodium 864 - 854 mmol/L 140   Potassium 3.5 - 5.1 mmol/L 4.3   Chloride 98 - 111 mmol/L 105   CO2 22 - 32 mmol/L 27   Calcium 8.9 - 10.3 mg/dL 9.1   Total Protein 6.5 - 8.1 g/dL 6.5   Total Bilirubin 0.3 - 1.2 mg/dL 1.2   Alkaline Phos 38 - 126  U/L 60   AST 15 - 41 U/L 21   ALT  0 - 44 U/L 18       Latest Ref Rng & Units 01/12/2020   11:16 AM  CBC  WBC 4.0 - 10.5 K/uL 8.1   Hemoglobin 12.0 - 15.0 g/dL 87.2   Hematocrit 63.9 - 46.0 % 39.0   Platelets 150 - 400 K/uL 230     Assessment and plan- Patient is a 83 y.o. female with history of right breast DCIS s/p lumpectomy and presently on exemestane  here for routine follow-up  Patient plans to stop her exemestane  in 2 weeks time 2 months shy of her planned 5-year course of treatment which I think is okay.  She is getting yearly mammograms coordinated by Dr. Cesar and will continue to follow-up with him in the future as well.  She is overdue for a bone density scan which I will schedule sometime in the next 3 months and see her for a virtual visit.  Her last bone density scan did show osteopenia with a T-score of -2.4.  Clinically she is doing well with no concerning signs and symptoms of recurrence based on today's exam   Visit Diagnosis 1. High risk medication use   2. Ductal carcinoma in situ (DCIS) of right breast   3. Use of exemestane  (Aromasin )   4. Osteopenia of right forearm      Dr. Annah Skene, MD, MPH [facility] [phone] 12/15/2023 2:38 PM

## 2023-12-15 NOTE — Progress Notes (Signed)
 Patient doing okay today with no new or acute concerns.

## 2024-03-01 ENCOUNTER — Other Ambulatory Visit: Payer: Self-pay | Admitting: General Surgery

## 2024-03-01 DIAGNOSIS — Z1231 Encounter for screening mammogram for malignant neoplasm of breast: Secondary | ICD-10-CM

## 2024-03-16 ENCOUNTER — Encounter

## 2024-03-16 ENCOUNTER — Other Ambulatory Visit

## 2024-04-14 ENCOUNTER — Telehealth: Admitting: Oncology

## 2024-04-26 ENCOUNTER — Ambulatory Visit
Admission: RE | Admit: 2024-04-26 | Discharge: 2024-04-26 | Disposition: A | Source: Ambulatory Visit | Attending: General Surgery

## 2024-04-26 ENCOUNTER — Ambulatory Visit
Admission: RE | Admit: 2024-04-26 | Discharge: 2024-04-26 | Disposition: A | Discharge status: Home / Self Care | Source: Ambulatory Visit | Attending: Oncology | Admitting: Oncology

## 2024-04-26 DIAGNOSIS — M810 Age-related osteoporosis without current pathological fracture: Secondary | ICD-10-CM | POA: Diagnosis not present

## 2024-04-26 DIAGNOSIS — Z780 Asymptomatic menopausal state: Secondary | ICD-10-CM | POA: Diagnosis not present

## 2024-04-26 DIAGNOSIS — Z1231 Encounter for screening mammogram for malignant neoplasm of breast: Secondary | ICD-10-CM | POA: Insufficient documentation

## 2024-04-26 DIAGNOSIS — D0511 Intraductal carcinoma in situ of right breast: Secondary | ICD-10-CM | POA: Insufficient documentation

## 2024-05-05 ENCOUNTER — Inpatient Hospital Stay: Attending: Oncology | Admitting: Oncology

## 2024-05-05 DIAGNOSIS — M810 Age-related osteoporosis without current pathological fracture: Secondary | ICD-10-CM

## 2024-05-05 NOTE — Progress Notes (Signed)
 Patient doing okay; no new or acute concerns at this time.

## 2024-05-05 NOTE — Progress Notes (Signed)
 I connected with Lauren Riley on 05/05/24 at 11:00 AM EST by video enabled telemedicine visit and verified that I am speaking with the correct person using two identifiers.   I discussed the limitations, risks, security and privacy concerns of performing an evaluation and management service by telemedicine and the availability of in-person appointments. I also discussed with the patient that there may be a patient responsible charge related to this service. The patient expressed understanding and agreed to proceed.  Other persons participating in the visit and their role in the encounter:  none  Patient's location:  home Provider's location:  work  Stage Manager Complaint: Discuss results of bone density scan  History of present illness: patient is a 84 year old female who underwent a screening mammogram in September 2020 which showed a suspicious 9 mm mass in the right breast 9:30 position.  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  She was seen by Dr. 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  No evidence of invasive carcinoma.  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   Patient's case was discussed at tumor board and radiation therapy adjuvantly was not deemed to be necessary.  She was started on Arimidex  in November 2020 which she did not tolerate due to worsening joint pain and back pain and was switched to Aromasin .  She completed 5 years of endocrine therapy in November 2025  Interval history she is doing well presently and denies any specific complaints at this time   Review of Systems  Constitutional:  Negative for chills, fever, malaise/fatigue and weight loss.  HENT:  Negative for congestion, ear discharge and  nosebleeds.   Eyes:  Negative for blurred vision.  Respiratory:  Negative for cough, hemoptysis, sputum production, shortness of breath and wheezing.   Cardiovascular:  Negative for chest pain, palpitations, orthopnea and claudication.  Gastrointestinal:  Negative for abdominal pain, blood in stool, constipation, diarrhea, heartburn, melena, nausea and vomiting.  Genitourinary:  Negative for dysuria, flank pain, frequency, hematuria and urgency.  Musculoskeletal:  Negative for back pain, joint pain and myalgias.  Skin:  Negative for rash.  Neurological:  Negative for dizziness, tingling, focal weakness, seizures, weakness and headaches.  Endo/Heme/Allergies:  Does not bruise/bleed easily.  Psychiatric/Behavioral:  Negative for depression and suicidal ideas. The patient does not have insomnia.     Allergies[1]  Past Medical History:  Diagnosis Date   Anemia 2015   has had iron infusions   Asthma    Breast cancer (HCC) 01/2019   unsure if benign or malignant at this point in time   Cancer [facility])    skin ca   Chronic diarrhea    Difficult intubation    Dyspnea    with exercise   Dysrhythmia    during knee replacement/ no issues now   GERD (gastroesophageal reflux disease)    Hypertension    IBS (irritable bowel syndrome)    Macular degeneration    Wears dentures    partial upper    Past Surgical History:  Procedure Laterality Date   ABDOMINAL HYSTERECTOMY     BREAST BIOPSY Left    unsure which side and between 1974 and 1980 Negative was a cyst   BREAST BIOPSY Right 01/12/2019   intraductal papilloma   BREAST LUMPECTOMY Right 01/26/2019`   intraductal papilloma excisied.  CHOLECYSTECTOMY     COLON SURGERY  1995   diverticulitis that had ruptured   COLOSTOMY CLOSURE  1995   EXCISION NASAL MASS Right 08/07/2022   Procedure: EXCISION NASAL LESION NASOPHARYNX;  Surgeon: Edda Mt, MD;  Location: [facility];  Service: ENT;  Laterality: Right;   EYE SURGERY  Bilateral    cataract extractions   FRACTURE SURGERY     femur, ulna and radius. metal removed   JOINT REPLACEMENT Bilateral 2013, 2015   bilateral knee   NASAL TURBINATE REDUCTION Bilateral 08/07/2022   Procedure: INFERIOR TURBINATE REDUCTION;  Surgeon: Edda Mt, MD;  Location: [facility];  Service: ENT;  Laterality: Bilateral;   NASOPHARYNGOSCOPY N/A 08/07/2022   Procedure: NASOPHARYNGOSCOPY;  Surgeon: Edda Mt, MD;  Location: [facility];  Service: ENT;  Laterality: N/A;   PARTIAL MASTECTOMY WITH NEEDLE LOCALIZATION Right 01/26/2019   Procedure: PARTIAL MASTECTOMY WITH NEEDLE LOCALIZATION;  Surgeon: Rodolph Romano, MD;  Location: [facility];  Service: General;  Laterality: Right;   REPLACEMENT TOTAL KNEE Bilateral    TONSILLECTOMY      Social History   Socioeconomic History   Marital status: Married    Spouse name: Dan   Number of children: Not on file   Years of education: Not on file   Highest education level: Not on file  Occupational History   Occupation: school teacher    Comment: retired  Tobacco Use   Smoking status: Never   Smokeless tobacco: Never  Vaping Use   Vaping status: Never Used  Substance and Sexual Activity   Alcohol use: Never   Drug use: Never   Sexual activity: Not on file  Other Topics Concern   Not on file  Social History Narrative   Recently transplanted to live near son and his family.   One daughter died in an MVA that patient was also in.   Social Drivers of Health   Tobacco Use: Low Risk (12/15/2023)   Patient History    Smoking Tobacco Use: Never    Smokeless Tobacco Use: Never    Passive Exposure: Not on file  Financial Resource Strain: Patient Declined (10/04/2023)   Received from [facility]   Overall Financial Resource Strain (CARDIA)    Difficulty of Paying Living Expenses: Patient declined  Food Insecurity: Patient Declined (10/04/2023)   Received from [facility]    Epic    Within the past 12 months, you worried that your food would run out before you got the money to buy more.: Patient declined    Within the past 12 months, the food you bought just didn't last and you didn't have money to get more.: Patient declined  Transportation Needs: Unmet Transportation Needs (10/04/2023)   Received from [facility]   PRAPARE - Transportation    In the past 12 months, has lack of transportation kept you from medical appointments or from getting medications?: Yes    Lack of Transportation (Non-Medical): Yes  Physical Activity: Not on file  Stress: Not on file  Social Connections: Not on file  Intimate Partner Violence: Not on file  Depression (PHQ2-9): Low Risk (05/05/2024)   Depression (PHQ2-9)    PHQ-2 Score: 0  Alcohol Screen: Not on file  Housing: Patient Declined (10/04/2023)   Received from [facility]   Epic    In the last 12 months, was there a time when you were not able to pay the mortgage or rent on time?:  Patient declined    In the past 12 months, how many times have you moved where you were living?: 0    At any time in the past 12 months, were you homeless or living in a shelter (including now)?: Patient declined  Utilities: Patient Declined (10/04/2023)   Received from [facility]   Epic    In the past 12 months has the electric, gas, oil, or water company threatened to shut off services in your home?: Patient declined  Health Literacy: Not on file    Family History  Problem Relation Age of Onset   Breast cancer Sister 85   Breast cancer Maternal Aunt        mat aunt   Parkinson's disease Mother    Congestive Heart Failure Father    Hypertension Father     Current Medications[2]  MM 3D SCREENING MAMMOGRAM BILATERAL BREAST Result Date: 04/28/2024 CLINICAL DATA:  Screening. EXAM: DIGITAL SCREENING BILATERAL MAMMOGRAM WITH TOMOSYNTHESIS AND CAD TECHNIQUE: Bilateral screening digital  craniocaudal and mediolateral oblique mammograms were obtained. Bilateral screening digital breast tomosynthesis was performed. The images were evaluated with computer-aided detection. COMPARISON:  Previous exam(s). ACR Breast Density Category b: There are scattered areas of fibroglandular density. FINDINGS: There are no findings suspicious for malignancy. IMPRESSION: No mammographic evidence of malignancy. A result letter of this screening mammogram will be mailed directly to the patient. RECOMMENDATION: Screening mammogram in one year. (Code:SM-B-01Y) BI-RADS CATEGORY  1: Negative. Electronically Signed   By: Alm Parkins M.D.   On: 04/28/2024 12:40   DG Bone Density Result Date: 04/27/2024 EXAM: DUAL X-RAY ABSORPTIOMETRY (DXA) FOR BONE MINERAL DENSITY 04/26/2024 1:47 pm CLINICAL DATA:  84 year old Female Postmenopausal. Cancer History of fragility fracture. TECHNIQUE: An axial (e.g., hips, spine) and/or appendicular (e.g., radius) exam was performed, as appropriate, using GE Secretary/administrator at [facility]. Images are obtained for bone mineral density measurement and are not obtained for diagnostic purposes. [id number] Exclusions: Lumbar spine due to degenerative changes. COMPARISON:  07/11/2021. FINDINGS: Scan quality: Good. LEFT FEMORAL NECK: BMD (in g/cm2): 0.818 T-score: -1.6 Z-score: 0.7 LEFT TOTAL HIP: BMD (in g/cm2): 0.858 T-score: -1.2 Z-score: 1.0 RIGHT FEMORAL NECK: BMD (in g/cm2): 0.831 T-score: -1.5 Z-score: 0.8 RIGHT TOTAL HIP: BMD (in g/cm2): 0.822 T-score: -1.5 Z-score: 0.7 DUAL-FEMUR TOTAL MEAN: Rate of change from previous exam: -4.4 % RIGHT FOREARM (RADIUS 33%): BMD (in g/cm2): 0.622 T-score: -2.9 Z-score: 0.1 Rate of change from previous exam: -7.0 % FRAX 10-YEAR PROBABILITY OF FRACTURE: FRAX not reported as the lowest BMD is not in the osteopenia range. 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 Fracture risk is increased. Increased risk is based on low BMD and  history of fragility fracture. RECOMMENDATIONS: 1. All patients should optimize calcium and vitamin D intake. 2. Consider FDA-approved medical therapies in postmenopausal women and men aged 88 years and older, based on the following: - A hip or vertebral (clinical or morphometric) fracture - T-score less than or equal to -2.5 and secondary causes have been excluded. - Low bone mass (T-score between -1.0 and -2.5) and a 10-year probability of a hip fracture greater than or equal to 3% or a 10-year probability of a major osteoporosis-related fracture greater than or equal to 20% based on the [place] -adapted WHO algorithm. - Clinician judgment and/or patient preferences may indicate treatment for people with 10-year fracture probabilities above or below these levels 3. Patients with diagnosis of osteoporosis or at high risk for fracture should have  regular bone mineral density tests. For patients eligible for Medicare, routine testing is allowed once every 2 years. The testing frequency can be increased to one year for patients who have rapidly progressing disease, those who are receiving or discontinuing medical therapy to restore bone mass, or have additional risk factors. Electronically Signed   By: Dina  Arceo M.D.   On: 04/27/2024 04:46    No images are attached to the encounter.      Latest Ref Rng & Units 01/12/2020   11:16 AM  CMP  Glucose 70 - 99 mg/dL 99   BUN 8 - 23 mg/dL 16   Creatinine 9.55 - 1.00 mg/dL 9.07   Sodium 864 - 854 mmol/L 140   Potassium 3.5 - 5.1 mmol/L 4.3   Chloride 98 - 111 mmol/L 105   CO2 22 - 32 mmol/L 27   Calcium 8.9 - 10.3 mg/dL 9.1   Total Protein 6.5 - 8.1 g/dL 6.5   Total Bilirubin 0.3 - 1.2 mg/dL 1.2   Alkaline Phos 38 - 126 U/L 60   AST 15 - 41 U/L 21   ALT 0 - 44 U/L 18       Latest Ref Rng & Units 01/12/2020   11:16 AM  CBC  WBC 4.0 - 10.5 K/uL 8.1   Hemoglobin 12.0 - 15.0 g/dL 87.2   Hematocrit 63.9 - 46.0 % 39.0   Platelets 150 - 400 K/uL 230       Observation/objective: Appears in no acute distress over video visit today.  Breathing is nonlabored  Assessment and plan: Patient is a 84 year old female with history of right breast DCIS s/p lumpectomy and 5 years of adjuvant endocrine therapy.  This is a visit to discuss bone density scan results and further manage  Patient's last bone density scan in April 2023 showed osteopenia with a T-score of -2.4 in the right forearm.  This has worsened to -2.9 with evidence of osteoporosis.  Patient has completed 5 years of endocrine therapy and therefore she is not on any medications that can make her osteoporosis worse.  She should continue with calcium 1200 mg and vitamin D 800 international units daily and get her vitamin D levels checked with her primary care doctor.  We did discuss the utility of bisphosphonates in the management of osteoporosis.  I discussed risks and benefits of both oral bisphosphonate such as Fosamax and parenteral bisphosphonate such as yearly Reclast or Prolia every 6 months .  Patient does not wish to try weekly Fosamax as she is concerned about potential GI side effects associated with it.  Discussed potential downsides of parenteral bisphosphonates including all but not limited to possible risk of osteonecrosis of the jaw and need for dental clearance prior to initiation.  Patient is leaning towards yearly Reclast but she will let us  know if she wants to proceed or not.  Have also encouraged her to speak to her primary care doctor if she wishes for Dr. Delfina to manage her osteoporosis which would be entirely reasonable  Follow-up instructions: I will see her back in 1 year for routine breast exam  I discussed the assessment and treatment plan with the patient. The patient was provided an opportunity to ask questions and all were answered. The patient agreed with the plan and demonstrated an understanding of the instructions.   The patient was advised to call back or seek  an in-person evaluation if the symptoms worsen or if the condition fails to improve as anticipated.  I provided 16 minutes of face-to-face video visit time during this encounter, and > 50% was spent counseling as documented under my assessment & plan.  Visit Diagnosis: 1. Osteoporosis of forearm     Dr. Annah Skene, MD, MPH [facility] at [facility] Tel- [phone] 05/05/2024 1:02 PM     [1]  Allergies Allergen Reactions   Nsaids     Other reaction(s):  GI UPSET; CHEST BURNING FROM IBUPROFEN    Duloxetine Other (See Comments)   Fenofibrate     Liver Disorder Elevated LFT's    Fish Oil Diarrhea    GI upset    Niacin     Flushing    Pravastatin Other (See Comments)    Mylagias    Tape Rash    ADHESIVE TAPE- OK W/ PAPER TAPES.   [2]  Current Outpatient Medications:    Acetaminophen  (TYLENOL  8 HOUR ARTHRITIS PAIN PO), Take 2 tablets by mouth in the morning and at bedtime., Disp: , Rfl:    Cholecalciferol 50 MCG (2000 UT) CAPS, Take by mouth., Disp: , Rfl:    cholestyramine (QUESTRAN) 4 g packet, Take by mouth., Disp: , Rfl:    exemestane  (AROMASIN ) 25 MG tablet, Take 1 tablet (25 mg total) by mouth daily after breakfast., Disp: 90 tablet, Rfl: 2   Melatonin 10 MG TABS, Take by mouth., Disp: , Rfl:    metoprolol succinate (TOPROL-XL) 100 MG 24 hr tablet, Take 100 mg by mouth daily. , Disp: , Rfl:    pantoprazole (PROTONIX) 40 MG tablet, Take 80 mg by mouth daily., Disp: , Rfl:    cephALEXin  (KEFLEX ) 500 MG capsule, Take 1 capsule (500 mg total) by mouth 2 (two) times daily. (Patient not taking: Reported on 11/11/2022), Disp: 14 capsule, Rfl: 0   loperamide (IMODIUM A-D) 2 MG tablet, Take 2 mg by mouth 3 (three) times daily as needed for diarrhea or loose stools. (Patient not taking: Reported on 11/11/2022), Disp: , Rfl:    Multiple Vitamins-Minerals (PRESERVISION AREDS 2) CAPS, Take 1 capsule by mouth 2 (two) times daily.  (Patient not taking: Reported on  11/11/2022), Disp: , Rfl:    predniSONE  (DELTASONE ) 10 MG tablet, Start with 3 pills tomorrow. Taper over the next 6 days.  3,3,2,2,1,1. (Patient not taking: Reported on 11/11/2022), Disp: 12 tablet, Rfl: 0   SUMAtriptan (IMITREX) 50 MG tablet, Take by mouth. (Patient not taking: Reported on 11/11/2022), Disp: , Rfl:

## 2025-05-05 ENCOUNTER — Inpatient Hospital Stay: Admitting: Oncology
